# Patient Record
Sex: Female | Born: 1985 | Race: Black or African American | Hispanic: No | Marital: Single | Smoking: Current every day smoker
Health system: Southern US, Community
[De-identification: ages and names within clinical notes are randomized; demographics above are authoritative.]

## PROBLEM LIST (undated history)

## (undated) DIAGNOSIS — A64 Unspecified sexually transmitted disease: Secondary | ICD-10-CM

## (undated) DIAGNOSIS — IMO0002 Reserved for concepts with insufficient information to code with codable children: Secondary | ICD-10-CM

## (undated) HISTORY — DX: Reserved for concepts with insufficient information to code with codable children: IMO0002

## (undated) HISTORY — DX: Unspecified sexually transmitted disease: A64

---
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
## 2007-07-04 ENCOUNTER — Other Ambulatory Visit: Admission: RE | Admit: 2007-07-04 | Discharge: 2007-07-04 | Payer: Self-pay | Admitting: Obstetrics and Gynecology

## 2009-11-03 ENCOUNTER — Emergency Department (HOSPITAL_COMMUNITY): Admission: EM | Admit: 2009-11-03 | Discharge: 2009-11-03 | Payer: Self-pay | Admitting: Emergency Medicine

## 2010-01-15 ENCOUNTER — Emergency Department (HOSPITAL_COMMUNITY): Admission: EM | Admit: 2010-01-15 | Discharge: 2010-01-15 | Payer: Self-pay | Admitting: Family Medicine

## 2010-01-21 ENCOUNTER — Ambulatory Visit: Payer: Self-pay | Admitting: Unknown Physician Specialty

## 2010-07-13 DIAGNOSIS — A64 Unspecified sexually transmitted disease: Secondary | ICD-10-CM

## 2010-07-13 HISTORY — DX: Unspecified sexually transmitted disease: A64

## 2010-09-28 LAB — POCT URINALYSIS DIP (DEVICE)
Glucose, UA: NEGATIVE mg/dL
Protein, ur: 100 mg/dL — AB
Specific Gravity, Urine: >=1.03 (ref 1.005–1.030)
Urobilinogen, UA: 4 mg/dL — ABNORMAL HIGH (ref 0.0–1.0)

## 2012-12-21 DIAGNOSIS — N920 Excessive and frequent menstruation with regular cycle: Secondary | ICD-10-CM | POA: Insufficient documentation

## 2013-05-05 ENCOUNTER — Other Ambulatory Visit: Payer: Self-pay | Admitting: Family Medicine

## 2013-05-05 ENCOUNTER — Ambulatory Visit
Admission: RE | Admit: 2013-05-05 | Discharge: 2013-05-05 | Disposition: A | Discharge status: Home / Self Care | Payer: PRIVATE HEALTH INSURANCE | Source: Ambulatory Visit | Attending: Family Medicine | Admitting: Family Medicine

## 2013-05-05 DIAGNOSIS — M25562 Pain in left knee: Secondary | ICD-10-CM

## 2013-05-05 DIAGNOSIS — M25552 Pain in left hip: Secondary | ICD-10-CM

## 2013-05-08 ENCOUNTER — Encounter: Payer: Self-pay | Admitting: Obstetrics and Gynecology

## 2013-05-08 ENCOUNTER — Ambulatory Visit (INDEPENDENT_AMBULATORY_CARE_PROVIDER_SITE_OTHER): Payer: 59 | Admitting: Obstetrics and Gynecology

## 2013-05-08 VITALS — BP 118/80 | HR 88 | Ht 63.0 in | Wt 205.5 lb

## 2013-05-08 DIAGNOSIS — Z01419 Encounter for gynecological examination (general) (routine) without abnormal findings: Secondary | ICD-10-CM

## 2013-05-08 DIAGNOSIS — Z113 Encounter for screening for infections with a predominantly sexual mode of transmission: Secondary | ICD-10-CM

## 2013-05-08 DIAGNOSIS — Z0000 Encounter for general adult medical examination without abnormal findings: Secondary | ICD-10-CM

## 2013-05-08 DIAGNOSIS — N926 Irregular menstruation, unspecified: Secondary | ICD-10-CM

## 2013-05-08 DIAGNOSIS — Z23 Encounter for immunization: Secondary | ICD-10-CM

## 2013-05-08 DIAGNOSIS — N390 Urinary tract infection, site not specified: Secondary | ICD-10-CM

## 2013-05-08 LAB — POCT URINALYSIS DIPSTICK
Bilirubin, UA: NEGATIVE
Glucose, UA: NEGATIVE

## 2013-05-08 LAB — HEMOGLOBIN, FINGERSTICK: Hemoglobin, fingerstick: 13.8 g/dL (ref 12.0–16.0)

## 2013-05-08 MED ORDER — NITROFURANTOIN MONOHYD MACRO 100 MG PO CAPS: 100.0000 mg | ORAL_CAPSULE | Freq: Two times a day (BID) | ORAL | Status: DC

## 2013-05-08 NOTE — Patient Instructions (Signed)
EXERCISE AND DIET:  We recommended that you start or continue a regular exercise program for good health. Regular exercise means any activity that makes your heart beat faster and makes you sweat.  We recommend exercising at least 30 minutes per day at least 3 days a week, preferably 4 or 5.  We also recommend a diet low in fat and sugar.  Inactivity, poor dietary choices and obesity can cause diabetes, heart attack, stroke, and kidney damage, among others.    ALCOHOL AND SMOKING:  Women should limit their alcohol intake to no more than 7 drinks/beers/glasses of wine (combined, not each!) per week. Moderation of alcohol intake to this level decreases your risk of breast cancer and liver damage. And of course, no recreational drugs are part of a healthy lifestyle.  And absolutely no smoking or even second hand smoke. Most people know smoking can cause heart and lung diseases, but did you know it also contributes to weakening of your bones? Aging of your skin?  Yellowing of your teeth and nails?  CALCIUM AND VITAMIN D:  Adequate intake of calcium and Vitamin D are recommended.  The recommendations for exact amounts of these supplements seem to change often, but generally speaking 600 mg of calcium (either carbonate or citrate) and 800 units of Vitamin D per day seems prudent. Certain women may benefit from higher intake of Vitamin D.  If you are among these women, your doctor will have told you during your visit.    PAP SMEARS:  Pap smears, to check for cervical cancer or precancers,  have traditionally been done yearly, although recent scientific advances have shown that most women can have pap smears less often.  However, every woman still should have a physical exam from her gynecologist every year. It will include a breast check, inspection of the vulva and vagina to check for abnormal growths or skin changes, a visual exam of the cervix, and then an exam to evaluate the size and shape of the uterus and  ovaries.  And after 25 years of age, a rectal exam is indicated to check for rectal cancers. We will also provide age appropriate advice regarding health maintenance, like when you should have certain vaccines, screening for sexually transmitted diseases, bone density testing, colonoscopy, mammograms, etc.   MAMMOGRAMS:  All women over 40 years old should have a yearly mammogram. Many facilities now offer a "3D" mammogram, which may cost around $50 extra out of pocket. If possible,  we recommend you accept the option to have the 3D mammogram performed.  It both reduces the number of women who will be called back for extra views which then turn out to be normal, and it is better than the routine mammogram at detecting truly abnormal areas.    COLONOSCOPY:  Colonoscopy to screen for colon cancer is recommended for all women at age 50.  We know, you hate the idea of the prep.  We agree, BUT, having colon cancer and not knowing it is worse!!  Colon cancer so often starts as a polyp that can be seen and removed at colonscopy, which can quite literally save your life!  And if your first colonoscopy is normal and you have no family history of colon cancer, most women don't have to have it again for 10 years.  Once every ten years, you can do something that may end up saving your life, right?  We will be happy to help you get it scheduled when you are ready.    Be sure to check your insurance coverage so you understand how much it will cost.  It may be covered as a preventative service at no cost, but you should check your particular policy.     Urinary Tract Infection Urinary tract infections (UTIs) can develop anywhere along your urinary tract. Your urinary tract is your body's drainage system for removing wastes and extra water. Your urinary tract includes two kidneys, two ureters, a bladder, and a urethra. Your kidneys are a pair of bean-shaped organs. Each kidney is about the size of your fist. They are located  below your ribs, one on each side of your spine. CAUSES Infections are caused by microbes, which are microscopic organisms, including fungi, viruses, and bacteria. These organisms are so small that they can only be seen through a microscope. Bacteria are the microbes that most commonly cause UTIs. SYMPTOMS  Symptoms of UTIs may vary by age and gender of the patient and by the location of the infection. Symptoms in young women typically include a frequent and intense urge to urinate and a painful, burning feeling in the bladder or urethra during urination. Older women and men are more likely to be tired, shaky, and weak and have muscle aches and abdominal pain. A fever may mean the infection is in your kidneys. Other symptoms of a kidney infection include pain in your back or sides below the ribs, nausea, and vomiting. DIAGNOSIS To diagnose a UTI, your caregiver will ask you about your symptoms. Your caregiver also will ask to provide a urine sample. The urine sample will be tested for bacteria and white blood cells. White blood cells are made by your body to help fight infection. TREATMENT  Typically, UTIs can be treated with medication. Because most UTIs are caused by a bacterial infection, they usually can be treated with the use of antibiotics. The choice of antibiotic and length of treatment depend on your symptoms and the type of bacteria causing your infection. HOME CARE INSTRUCTIONS  If you were prescribed antibiotics, take them exactly as your caregiver instructs you. Finish the medication even if you feel better after you have only taken some of the medication.  Drink enough water and fluids to keep your urine clear or pale yellow.  Avoid caffeine, tea, and carbonated beverages. They tend to irritate your bladder.  Empty your bladder often. Avoid holding urine for long periods of time.  Empty your bladder before and after sexual intercourse.  After a bowel movement, women should cleanse  from front to back. Use each tissue only once. SEEK MEDICAL CARE IF:   You have back pain.  You develop a fever.  Your symptoms do not begin to resolve within 3 days. SEEK IMMEDIATE MEDICAL CARE IF:   You have severe back pain or lower abdominal pain.  You develop chills.  You have nausea or vomiting.  You have continued burning or discomfort with urination. MAKE SURE YOU:   Understand these instructions.  Will watch your condition.  Will get help right away if you are not doing well or get worse. Document Released: 04/08/2005 Document Revised: 12/29/2011 Document Reviewed: 08/07/2011 [facility] Patient Information 2014 [place], [place].  Nitrofurantoin tablets or capsules What is this medicine? NITROFURANTOIN (nye troe fyoor AN toyn) is an antibiotic. It is used to treat urinary tract infections. This medicine may be used for other purposes; ask your health care provider or pharmacist if you have questions. What should I tell my health care provider before I take this  medicine? They need to know if you have any of these conditions: -anemia -diabetes -glucose-6-phosphate dehydrogenase deficiency -kidney disease -liver disease -lung disease -other chronic illness -an unusual or allergic reaction to nitrofurantoin, other antibiotics, other medicines, foods, dyes or preservatives -pregnant or trying to get pregnant -breast-feeding How should I use this medicine? Take this medicine by mouth with a glass of water. Follow the directions on the prescription label. Take this medicine with food or milk. Take your doses at regular intervals. Do not take your medicine more often than directed. Do not stop taking except on your doctor's advice. Talk to your pediatrician regarding the use of this medicine in children. While this drug may be prescribed for selected conditions, precautions do apply. Overdosage: If you think you have taken too much of this medicine contact a poison control  center or emergency room at once. NOTE: This medicine is only for you. Do not share this medicine with others. What if I miss a dose? If you miss a dose, take it as soon as you can. If it is almost time for your next dose, take only that dose. Do not take double or extra doses. What may interact with this medicine? -antacids containing magnesium trisilicate -probenecid -quinolone antibiotics like ciprofloxacin, lomefloxacin, norfloxacin and ofloxacin -sulfinpyrazone This list may not describe all possible interactions. Give your health care provider a list of all the medicines, herbs, non-prescription drugs, or dietary supplements you use. Also tell them if you smoke, drink alcohol, or use illegal drugs. Some items may interact with your medicine. What should I watch for while using this medicine? Tell your doctor or health care professional if your symptoms do not improve or if you get new symptoms. Drink several glasses of water a day. If you are taking this medicine for a long time, visit your doctor for regular checks on your progress. If you are diabetic, you may get a false positive result for sugar in your urine with certain brands of urine tests. Check with your doctor. What side effects may I notice from receiving this medicine? Side effects that you should report to your doctor or health care professional as soon as possible: -allergic reactions like skin rash or hives, swelling of the face, lips, or tongue -chest pain -cough -difficulty breathing -dizziness, drowsiness -fever or infection -joint aches or pains -pale or blue-tinted skin -redness, blistering, peeling or loosening of the skin, including inside the mouth -tingling, burning, pain, or numbness in hands or feet -unusual bleeding or bruising -unusually weak or tired -yellowing of eyes or skin Side effects that usually do not require medical attention (report to your doctor or health care professional if they continue  or are bothersome): -dark urine -diarrhea -headache -loss of appetite -nausea or vomiting -temporary hair loss This list may not describe all possible side effects. Call your doctor for medical advice about side effects. You may report side effects to FDA at 1-800-FDA-1088. Where should I keep my medicine? Keep out of the reach of children. Store at room temperature between 15 and 30 degrees C (59 and 86 degrees F). Protect from light. Throw away any unused medicine after the expiration date. NOTE: This sheet is a summary. It may not cover all possible information. If you have questions about this medicine, talk to your doctor, pharmacist, or health care provider.  2013, [employer]. (01/18/2008 3:56:47 PM)

## 2013-05-08 NOTE — Progress Notes (Signed)
Patient ID: Kristine [name], female   DOB: [birth year], 27 y.o.   MRN: [id number] GYNECOLOGY VISIT  PCP:  None  Referring provider:   HPI: 27 y.o.   Single  African American  female   G0P0 with Patient's last menstrual period was 04/07/2013.   here for   AEX. Trying for pregnancy and three years.  No contraception for 1 1/2 years.  Never been pregnant before and partner has not fathered any children. Menses irregular.  Has  3 menses per year. Has times when she bleeds for months at at time.  No cramping with menses.   No prior evaluation.  Used condoms for contraception in past.  No hormonal contraception.  Has dyspareunia and dysuria with voiding after intercourse, onset the past year.  Has a history of UTI 2 years ago.    Treated for PID in 2012.  Diagnosed with chlamydia.    Hgb:    13.8 Urine:  Pos. Nitrites, 1+WBC's, Trace RBC's  GYNECOLOGIC HISTORY: Patient's last menstrual period was 04/07/2013. Sexually active:  yes Partner preference: female Contraception:  Trying for pregnancy  Menopausal hormone therapy: n/a DES exposure:   n/a Blood transfusions:  no  Sexually transmitted diseases:   Treated for Chlamydia in 2012 GYN Procedures:  no Mammogram:   n/a              Pap:  2009 wnl  History of abnormal pap smear:  ?2007 had abnormal pap and possibly colposcopy but no treatment of cervix.  Paps reverted to normal.   OB History   Grav Para Term Preterm Abortions TAB SAB Ect Mult Living   0                LIFESTYLE: Exercise:       Sit ups and leg crunches        Tobacco:       1/2 pack per day Alcohol:          1 glass of wine per week Drug use:       no  OTHER HEALTH MAINTENANCE: Tetanus/TDap:    Unsure. Gardisil:    no Influenza:   never Zostavax:   n/a  Bone density:  n/a Colonoscopy:  n/a  Cholesterol check:  unsure  History reviewed. No pertinent family history.  There are no active problems to display for this patient.  Past Medical  History  Diagnosis Date  . Dyspareunia   . STD (sexually transmitted disease) 2012    Tx' d for Chlamydia    Past Surgical History  Procedure Laterality Date  . Breast surgery  2000    benign biopsy    ALLERGIES: Review of patient's allergies indicates no known allergies.  No current outpatient prescriptions on file.   No current facility-administered medications for this visit.     ROS:  Pertinent items are noted in HPI.  SOCIAL HISTORY:    PHYSICAL EXAMINATION:    BP 118/80  Pulse 88  Ht 5\' 3"  (1.6 m)  Wt 205 lb 8 oz (93.214 kg)  BMI 36.41 kg/m2  LMP 04/07/2013   Wt Readings from Last 3 Encounters:  05/08/13 205 lb 8 oz (93.214 kg)     Ht Readings from Last 3 Encounters:  05/08/13 5\' 3"  (1.6 m)    General appearance: alert, cooperative and appears stated age Head: Normocephalic, without obvious abnormality, atraumatic Neck: no adenopathy, supple, symmetrical, trachea midline and thyroid not enlarged, symmetric, no tenderness/mass/nodules Lungs: clear to auscultation bilaterally Breasts: Inspection  negative, No nipple retraction or dimpling, No nipple discharge or bleeding, No axillary or supraclavicular adenopathy, Normal to palpation without dominant masses Heart: regular rate and rhythm Abdomen: soft, non-tender; no masses,  no organomegaly Extremities: extremities normal, atraumatic, no cyanosis or edema Skin: Skin color, texture, turgor normal. No rashes or lesions Lymph nodes: Cervical, supraclavicular, and axillary nodes normal. No abnormal inguinal nodes palpated Neurologic: Grossly normal  Pelvic: External genitalia:  no lesions              Urethra:  normal appearing urethra with no masses, tenderness or lesions              Bartholins and Skenes: normal                 Vagina: normal appearing vagina with normal color and discharge, no lesions              Cervix: normal appearance.  Some small amount of menstrual flow from os.               Pap  and high risk HPV testing done: yes, reflex HPV testing..            Bimanual Exam:  Uterus:  uterus is normal size, shape, consistency and nontender                                      Adnexa: normal adnexa in size, nontender and no masses                                      Rectovaginal: Confirms                                      Anus:  normal sphincter tone, no lesions  ASSESSMENT  Normal gynecologic exam. Desire for pregnancy. Irregular menses.  History of PID. Need for Tdap. UTI suspected. Will treat as patient is symptomatic.  Smoker.  PLAN  Mammogram Pap smear and reflex  high risk HPV testing Labs:  GC/CT, HIV, RPR, Hep C, and Hep B.             FSH, LH, prolactin, TSH, estradiol. Macrobid 100 mg po bid for 7 days.   UC sent.  TDap. Discussed smoking cessation. Start PNV.  Patient will return for discussion of labs and planning for future fertility - HSG, semen analysis, and planning for ovulation evaluation and monitoring.  Return annually.   An After Visit Summary was printed and given to the patient.

## 2013-05-09 LAB — STD PANEL: Hepatitis B Surface Ag: NEGATIVE

## 2013-05-09 LAB — GC/CHLAMYDIA PROBE AMP, URINE
Chlamydia, Swab/Urine, PCR: POSITIVE — AB
GC Probe Amp, Urine: NEGATIVE

## 2013-05-09 LAB — PROLACTIN: Prolactin: 9.5 ng/mL

## 2013-05-09 LAB — IPS PAP TEST WITH REFLEX TO HPV

## 2013-05-09 LAB — ESTRADIOL: Estradiol: 48.1 pg/mL

## 2013-05-09 LAB — HEPATITIS C ANTIBODY: HCV Ab: NEGATIVE

## 2013-05-09 LAB — FSH/LH: FSH: 5.2 m[IU]/mL

## 2013-05-10 ENCOUNTER — Other Ambulatory Visit: Payer: Self-pay | Admitting: Obstetrics and Gynecology

## 2013-05-10 LAB — URINE CULTURE: Colony Count: >=100000

## 2013-05-10 MED ORDER — AZITHROMYCIN 1 G PO PACK: 1.0000 | PACK | Freq: Once | ORAL | Status: DC

## 2013-05-11 ENCOUNTER — Other Ambulatory Visit: Payer: Self-pay | Admitting: Obstetrics and Gynecology

## 2013-05-11 MED ORDER — CIPROFLOXACIN HCL 500 MG PO TABS: 500.0000 mg | ORAL_TABLET | Freq: Two times a day (BID) | ORAL | Status: DC

## 2013-05-12 ENCOUNTER — Telehealth: Payer: Self-pay | Admitting: Obstetrics and Gynecology

## 2013-05-12 NOTE — Telephone Encounter (Signed)
Dr. Edward Jolly, since the zithromax is only one dose is it okay to take both the Cipro and Zithromax?

## 2013-05-12 NOTE — Telephone Encounter (Signed)
Patient calling to make sure it is ok to take medications together cipro and and zithromax. She didn't know if she could take them together or if she needed to wait until she finished with the first medication. ( NO Chart)

## 2013-05-12 NOTE — Telephone Encounter (Signed)
Take the Zithromax separately.  This is just a one time dose of this.  Take the Cipro two hours later.  I think that both together will cause GI upset.

## 2013-05-12 NOTE — Telephone Encounter (Signed)
Spoke with patient and message from Dr. Edward Jolly given.  Verbalized understanding and will f/u prn.

## 2013-05-17 ENCOUNTER — Encounter: Payer: Self-pay | Admitting: Obstetrics and Gynecology

## 2013-05-17 ENCOUNTER — Ambulatory Visit (INDEPENDENT_AMBULATORY_CARE_PROVIDER_SITE_OTHER): Payer: 59 | Admitting: Obstetrics and Gynecology

## 2013-05-17 ENCOUNTER — Telehealth: Payer: Self-pay | Admitting: Gynecology

## 2013-05-17 VITALS — BP 118/64 | HR 76 | Wt 205.0 lb

## 2013-05-17 DIAGNOSIS — A568 Sexually transmitted chlamydial infection of other sites: Secondary | ICD-10-CM

## 2013-05-17 DIAGNOSIS — N390 Urinary tract infection, site not specified: Secondary | ICD-10-CM

## 2013-05-17 DIAGNOSIS — N979 Female infertility, unspecified: Secondary | ICD-10-CM

## 2013-05-17 NOTE — Progress Notes (Signed)
Patient ID: Kristine [name], female   DOB: 1986-03-13, 27 y.o.   MRN: [id number]  Subjective  Here for a discussion of lab results.  LMP 05/08/13.  Took Azithromycin and vomited 4 - 5 hours later.    Started the Macrobid but had abdominal cramping and so was switched to Ciprofloxacin, which she did not take yet. Has this Rx at home.    Came with questions today about what to take and for what reason.    Pain in lungs today, sharp. No nausea or vomiting today.  No fevers and chills.  Eating OK.   Partner has an appointment to have evaluation and treatment for chlamydia and STDs.    Patient had chlamydia treatment and a negative test of cure in 2012.   Patient also would like to proceed with evaluation for fertility.  Unprotected intercourse for over one year.   Objective  Gen - No acute distress. Lungs - CTA bilaterally. Cor - S1S2 RRR. Abdomen - positive bowel sounds, soft, nontender, nondistended.  No hepatosplenomegaly or organomegaly.   FSH 52. LH 13.2 Estradiol 48.1 TSH and Prolactin - normal.  Assessment  Chlamydia Trachomatis. Urinary tract infection, untreated. History of prior PID. Primary infertility.  Ovulatory component.    Plan  Start Ciprofloxacin. Follow up in 9 days for chlamydia test of cure and repeat urine culture. I discussed the work up and evaluation for infertility along with treatment with Clomid. After infection is treated, will proceed with semen analysis and  Hysterosalpingogram. Patient understands that she and partner both need to be treated for chlamydia.   25 minutes face to face time of which over 505 was spent in counseling the patient.  After visit summary to the patient.

## 2013-05-17 NOTE — Patient Instructions (Signed)
Chlamydia, Female Chlamydia is an infection caused by bacteria. It is spread through sexual contact. Chlamydia can be in different areas of the body. These areas include the cervix, urethra, throat, or rectum. If you are infected, you must finish all treatments and follow up with a caregiver.  CAUSES  Chlamydia is a sexually transmitted disease. It is passed from an infected partner during intimate contact. This contact could be with the genitals, mouth, or rectal area. Infections can also be passed from mothers to babies during birth. SYMPTOMS  There may not be any symptoms. This is often the case early in the infection. Symptoms you may notice include:  Mild pain and discomfort when urinating.  Inflammation of the rectum.  Vaginal discharge.  Painful intercourse.  Abdominal pain.  Bleeding between menstrual periods. DIAGNOSIS  To diagnose this infection, your caregiver will do a pelvic exam. Cultures will be taken of the vagina, cervix, urine, and possibly the rectum to see if the infection is chlamydia. TREATMENT You will be given antibiotic medicines. Any sexual partners should also be treated, even if they do not show symptoms. Take the medicine for the prescribed length of time. If you are pregnant, do not take tetracycline-type antibiotics. HOME CARE INSTRUCTIONS   Take your antibiotics as directed. Finish them even if you start to feel better.  Only take over-the-counter or prescription medicines for pain, discomfort, or fever as directed by your caregiver.  Inform any sexual partners about the infection. They should be treated also.  Do not have sexual contact until your caregiver tells you it is okay.  Get plenty of rest.  Eat a well-balanced diet, and drink enough fluids to keep your urine clear or pale yellow.  Keep all follow-up appointments and tests. SEEK IMMEDIATE MEDICAL CARE IF:   Your symptoms return.  You have a fever. MAKE SURE YOU:   Understand these  instructions.  Will watch your condition.  Will get help right away if you are not doing well or get worse. Document Released: 04/08/2005 Document Revised: 09/21/2011 Document Reviewed: 02/15/2008 [facility] Patient Information 2014 [place], [place].  Urinary Tract Infection Urinary tract infections (UTIs) can develop anywhere along your urinary tract. Your urinary tract is your body's drainage system for removing wastes and extra water. Your urinary tract includes two kidneys, two ureters, a bladder, and a urethra. Your kidneys are a pair of bean-shaped organs. Each kidney is about the size of your fist. They are located below your ribs, one on each side of your spine. CAUSES Infections are caused by microbes, which are microscopic organisms, including fungi, viruses, and bacteria. These organisms are so small that they can only be seen through a microscope. Bacteria are the microbes that most commonly cause UTIs. SYMPTOMS  Symptoms of UTIs may vary by age and gender of the patient and by the location of the infection. Symptoms in young women typically include a frequent and intense urge to urinate and a painful, burning feeling in the bladder or urethra during urination. Older women and men are more likely to be tired, shaky, and weak and have muscle aches and abdominal pain. A fever may mean the infection is in your kidneys. Other symptoms of a kidney infection include pain in your back or sides below the ribs, nausea, and vomiting. DIAGNOSIS To diagnose a UTI, your caregiver will ask you about your symptoms. Your caregiver also will ask to provide a urine sample. The urine sample will be tested for bacteria and white blood cells.  White blood cells are made by your body to help fight infection. TREATMENT  Typically, UTIs can be treated with medication. Because most UTIs are caused by a bacterial infection, they usually can be treated with the use of antibiotics. The choice of antibiotic and length  of treatment depend on your symptoms and the type of bacteria causing your infection. HOME CARE INSTRUCTIONS  If you were prescribed antibiotics, take them exactly as your caregiver instructs you. Finish the medication even if you feel better after you have only taken some of the medication.  Drink enough water and fluids to keep your urine clear or pale yellow.  Avoid caffeine, tea, and carbonated beverages. They tend to irritate your bladder.  Empty your bladder often. Avoid holding urine for long periods of time.  Empty your bladder before and after sexual intercourse.  After a bowel movement, women should cleanse from front to back. Use each tissue only once. SEEK MEDICAL CARE IF:   You have back pain.  You develop a fever.  Your symptoms do not begin to resolve within 3 days. SEEK IMMEDIATE MEDICAL CARE IF:   You have severe back pain or lower abdominal pain.  You develop chills.  You have nausea or vomiting.  You have continued burning or discomfort with urination. MAKE SURE YOU:   Understand these instructions.  Will watch your condition.  Will get help right away if you are not doing well or get worse. Document Released: 04/08/2005 Document Revised: 12/29/2011 Document Reviewed: 08/07/2011 [facility] Patient Information 2014 [place], [place].  Infertility WHAT IS INFERTILITY?  Infertility is usually defined as not being able to get pregnant after trying for one year of regular sexual intercourse without the use of contraceptives. Or not being able to carry a pregnancy to term and have a baby. The infertility rate in the [place] is around 10%. Pregnancy is the result of a chain of events. A woman must release an egg from one of her ovaries (ovulation). The egg must be fertilized by the female sperm. Then it travels through a fallopian tube into the uterus (womb), where it attaches to the wall of the uterus and grows. A man must have enough sperm, and the sperm must  join with (fertilize) the egg along the way, at the proper time. The fertilized egg must then become attached to the inside of the uterus. While this may seem simple, many things can happen to prevent pregnancy from occurring.  WHOSE PROBLEM IS IT?  About 20% of infertility cases are due to problems with the man (female factors) and 65% are due to problems with the woman (female factors). Other cases are due to a combination of female and female factors or to unknown causes.  WHAT CAUSES INFERTILITY IN MEN?  Infertility in men is often caused by problems with making enough normal sperm or getting the sperm to reach the egg. Problems with sperm may exist from birth or develop later in life, due to illness or injury. Some men produce no sperm, or produce too few sperm (oligospermia). Other problems include:  Sexual dysfunction.  Hormonal or endocrine problems.  Age. Female fertility decreases with age, but not at as young an age as female fertility.  Infection.  Congenital problems. Birth defect, such as absence of the tubes that carry the sperm (vas deferens).  Genetic/chromosomal problems.  Antisperm antibody problems.  Retrograde ejaculation (sperm go into the bladder).  Varicoceles, spematoceles, or tumors of the testicles.  Lifestyle can influence the  number and quality of a man's sperm.  Alcohol and drugs can temporarily reduce sperm quality.  Environmental toxins, including pesticides and lead, may cause some cases of infertility in men. WHAT CAUSES INFERTILITY IN WOMEN?   Problems with ovulation account for most infertility in women. Without ovulation, eggs are not available to be fertilized.  Signs of problems with ovulation include irregular menstrual periods or no periods at all.  Simple lifestyle factors, including stress, diet, or athletic training, can affect a woman's hormonal balance.  Age. Fertility begins to decrease in women in the early 36s and is worse after age  53.  Much less often, a hormonal imbalance from a serious medical problem, such as a pituitary gland tumor, thyroid or other chronic medical disease, can cause ovulation problems.  Pelvic infections.  Polycystic ovary syndrome (increase in female hormones, unable to ovulate).  Alcohol or illegal drugs.  Environmental toxins, radiation, pesticides, and certain chemicals.  Aging is an important factor in female infertility.  The ability of a woman's ovaries to produce eggs declines with age, especially after age 58. About one third of couples where the woman is over 35 will have problems with fertility.  By the time she reaches menopause when her monthly periods stop for good, a woman can no longer produce eggs or become pregnant.  Other problems can also lead to infertility in women. If the fallopian tubes are blocked at one or both ends, the egg cannot travel through the tubes into the uterus. Scar tissue (adhesions) in the pelvis may cause blocked tubes. This may result from pelvic inflammatory disease, endometriosis, or surgery for an ectopic pregnancy (fertilized egg implanted outside the uterus) or any pelvic or abdominal surgery causing adhesions.  Fibroid tumors or polyps of the uterus.  Congenital (birth defect) abnormalities of the uterus.  Infection of the cervix (cervicitis).  Cervical stenosis (narrowing).  Abnormal cervical mucus.  Polycystic ovary syndrome.  Having sexual intercourse too often (every other day or 4 to 5 times a week).  Obesity.  Anorexia.  Poor nutrition.  Over exercising, with loss of body fat.  DES. Your mother received diethylstilbesterol hormone when pregnant with you. HOW IS INFERTILITY TESTED?  If you have been trying to have a baby without success, you may want to seek medical help. You should not wait for one year of trying before seeing a health care provider if:  You are over 35.  You have reason to believe that there may be a  fertility problem. A medical evaluation may determine the reasons for a couple's infertility. Usually this process begins with:  Physical exams.  Medical histories of both partners.  Sexual histories of both partners. If there is no obvious problem, like improperly timed intercourse or absence of ovulation, tests may be needed.   For a man, testing usually begins with tests of his semen to look at:  The number of sperm.  The shape of sperm.  Movement of his sperm.  Taking a complete medical and surgical history.  Physical examination.  Check for infection of the female reproductive organs. Sometimes hormone tests are done.   For a woman, the first step in testing is to find out if she is ovulating each month. There are several ways to do this. For example, she can keep track of changes in her morning body temperature and in the texture of her cervical mucus. Another tool is a home ovulation test kit, which can be bought at drug or grocery stores.  Checks of ovulation can also be done in the doctor's office, using blood tests for hormone levels or ultrasound tests of the ovaries. If the woman is ovulating, more tests will need to be done. Some common female tests include:  Hysterosalpingogram: An x-ray of the fallopian tubes and uterus after they are injected with dye. It shows if the tubes are open and shows the shape of the uterus.  Laparoscopy: An exam of the tubes and other female organs for disease. A lighted tube called a laparoscope is used to see inside the abdomen.  Endometrial biopsy: Sample of uterus tissue taken on the first day of the menstrual period, to see if the tissue indicates you are ovulating.  Transvaginal ultrasound: Examines the female organs.  Hysteroscopy: Uses a lighted tube to examine the cervix and inside the uterus, to see if there are any abnormalities inside the uterus. TREATMENT  Depending on the test results, different treatments can be suggested.  The type of treatment depends on the cause. 85 to 90% of infertility cases are treated with drugs or surgery.   Various fertility drugs may be used for women with ovulation problems. It is important to talk with your caregiver about the drug to be used. You should understand the drug's benefits and side effects. Depending on the type of fertility drug and the dosage of the drug used, multiple births (twins or multiples) can occur in some women.  If needed, surgery can be done to repair damage to a woman's ovaries, fallopian tubes, cervix, or uterus.  Surgery or medical treatment for endometriosis or polycystic ovary syndrome. Sometimes a man has an infertility problem that can be corrected with medicine or by surgery.  Intrauterine insemination (IUI) of sperm, timed with ovulation.  Change in lifestyle, if that is the cause (lose weight, increase exercise, and stop smoking, drinking excessively, or taking illegal drugs).  Other types of surgery:  Removing growths inside and on the uterus.  Removing scar tissue from inside of the uterus.  Fixing blocked tubes.  Removing scar tissue in the pelvis and around the female organs. WHAT IS ASSISTED REPRODUCTIVE TECHNOLOGY (ART)?  Assisted reproductive technology (ART) is another form of special methods used to help infertile couples. ART involves handling both the woman's eggs and the man's sperm. Success rates vary and depend on many factors. ART can be expensive and time-consuming. But ART has made it possible for many couples to have children that otherwise would not have been conceived. Some methods are listed below:  In vitro fertilization (IVF). IVF is often used when a woman's fallopian tubes are blocked or when a man has low sperm counts. A drug is used to stimulate the ovaries to produce multiple eggs. Once mature, the eggs are removed and placed in a culture dish with the man's sperm for fertilization. After about 40 hours, the eggs are  examined to see if they have become fertilized by the sperm and are dividing into cells. These fertilized eggs (embryos) are then placed in the woman's uterus. This bypasses the fallopian tubes.  Gamete intrafallopian transfer (GIFT) is similar to IVF, but used when the woman has at least one normal fallopian tube. Three to five eggs are placed in the fallopian tube, along with the man's sperm, for fertilization inside the woman's body.  Zygote intrafallopian transfer (ZIFT), also called tubal embryo transfer, combines IVF and GIFT. The eggs retrieved from the woman's ovaries are fertilized in the lab and placed in the fallopian tubes rather  than in the uterus.  ART procedures sometimes involve the use of donor eggs (eggs from another woman) or previously frozen embryos. Donor eggs may be used if a woman has impaired ovaries or has a genetic disease that could be passed on to her baby.  When performing ART, you are at higher risk for resulting in multiple pregnancies, twins, triplets or more.  Intracytoplasma sperm injection is a procedure that injects a single sperm into the egg to fertilize it.  Embryo transplant is a procedure that starts after growing an embryo in a special media (chemical solution) developed to keep the embryo alive for 2 to 5 days, and then transplanting it into the uterus. In cases where a cause cannot be found and pregnancy does not occur, adoption may be a consideration. Document Released: 07/02/2003 Document Revised: 09/21/2011 Document Reviewed: 05/28/2009 [facility] Patient Information 2014 [place], [place].

## 2013-05-17 NOTE — Telephone Encounter (Signed)
Patient is due for a 10 day recheck 05/26/13 but there are no appointment slots available. Please advise where we can schedule her and I will call the patient.

## 2013-05-17 NOTE — Telephone Encounter (Signed)
Hi Starla,  This is a quick appointment.  I will see her at 7:00 am on 05/26/13.  I know that there is also a new patient at this time, but she will not be ready for me to see at 7:00 am.  Thanks for checking with me!

## 2013-05-18 NOTE — Telephone Encounter (Signed)
Patient scheduled.

## 2013-05-26 ENCOUNTER — Encounter: Payer: Self-pay | Admitting: Obstetrics and Gynecology

## 2013-05-26 ENCOUNTER — Ambulatory Visit (INDEPENDENT_AMBULATORY_CARE_PROVIDER_SITE_OTHER): Payer: 59 | Admitting: Obstetrics and Gynecology

## 2013-05-26 VITALS — BP 120/80 | HR 80 | Temp 98.2°F | Ht 63.0 in | Wt 207.5 lb

## 2013-05-26 DIAGNOSIS — N926 Irregular menstruation, unspecified: Secondary | ICD-10-CM

## 2013-05-26 DIAGNOSIS — A749 Chlamydial infection, unspecified: Secondary | ICD-10-CM

## 2013-05-26 DIAGNOSIS — N390 Urinary tract infection, site not specified: Secondary | ICD-10-CM

## 2013-05-26 LAB — POCT URINALYSIS DIPSTICK
Nitrite, UA: NEGATIVE
Urobilinogen, UA: NEGATIVE

## 2013-05-26 MED ORDER — MEDROXYPROGESTERONE ACETATE 10 MG PO TABS: 10.0000 mg | ORAL_TABLET | Freq: Every day | ORAL | Status: DC

## 2013-05-26 NOTE — Progress Notes (Signed)
Patient ID: Kristine [name], female   DOB: 05/10/1986, 27 y.o.   MRN: [id number]  Subjective  Patient here for a recheck for UTI, chlamydia, and also irregular menses. Took Ciprofloxacin without problem.  No urgency to void. Some cramping when sits down and stands up.   No upper abdominal pain.  No fevers. No nausea or vomiting.   Took azithromycin and threw up 4 - 5 hours lager. Patient's partner has been treated for infection.   Menses irregular. LMP from 05/07/13 - 05/12/13. Restarted 05/14/13 - 05/21/13. Restarted 05/22/13 - 05/26/13. Has had both light spotting and heavy bleeding.  No pain or cramping.   Objective  BP 120/80   P 80 T 98.2  Urine dip - trace RBCs.  Assessment  Recent UTI.  Status post Ciprofloxacin. Recent chlamydia.  Status post azithromycin. Irregular menses.  Plan  UPT today. Urine culture. Urine GC/CT. Provera 10 mg po q day for 10 days.  Medication discussed with patient.  15 minutes face to face time with over 50% spent in counseling.   After visit summary to the patient.

## 2013-05-26 NOTE — Patient Instructions (Signed)
Please register with My Chart!  The Provera should help to regular your menstruation.   Medroxyprogesterone tablets What is this medicine? MEDROXYPROGESTERONE (me DROX ee proe JES te rone) is a hormone in a class called progestins. It is commonly used to prevent the uterine lining from overgrowth in women taking an estrogen after menopause. It is also used to treat irregular menstrual bleeding or a lack of menstrual bleeding in women. This medicine may be used for other purposes; ask your health care provider or pharmacist if you have questions. COMMON BRAND NAME(S): Amen, Provera What should I tell my health care provider before I take this medicine? They need to know if you have any of these conditions: -blood vessel disease or a history of a blood clot in the lungs or legs -breast, cervical or vaginal cancer -heart disease -kidney disease -liver disease -migraine -recent miscarriage or abortion -mental depression -migraine -seizures (convulsions) -stroke -vaginal bleeding that has not been evaluated -an unusual or allergic reaction to medroxyprogesterone, other medicines, foods, dyes, or preservatives -pregnant or trying to get pregnant -breast-feeding How should I use this medicine? Take this medicine by mouth with a glass of water. Follow the directions on the prescription label. Take your doses at regular intervals. Do not take your medicine more often than directed. Talk to your pediatrician regarding the use of this medicine in children. Special care may be needed. While this drug may be prescribed for children as young as 13 years for selected conditions, precautions do apply. Overdosage: If you think you have taken too much of this medicine contact a poison control center or emergency room at once. NOTE: This medicine is only for you. Do not share this medicine with others. What if I miss a dose? If you miss a dose, take it as soon as you can. If it is almost time for your  next dose, take only that dose. Do not take double or extra doses. What may interact with this medicine? -barbiturate medicines for inducing sleep or treating seizures (convulsions) -bosentan -carbamazepine -phenytoin -rifampin -St. John's Wort This list may not describe all possible interactions. Give your health care provider a list of all the medicines, herbs, non-prescription drugs, or dietary supplements you use. Also tell them if you smoke, drink alcohol, or use illegal drugs. Some items may interact with your medicine. What should I watch for while using this medicine? Visit your health care professional for regular checks on your progress. You will need a regular breast and pelvic exam. If you have any reason to think you are pregnant, stop taking this medicine at once and contact your doctor or health care professional. What side effects may I notice from receiving this medicine? Side effects that you should report to your doctor or health care professional as soon as possible: -breast tenderness or discharge -changes in mood or emotions, such as depression -changes in vision or speech -pain in the abdomen, chest, groin, or leg -severe headache -skin rash, itching, or hives -sudden shortness of breath -unusually weak or tired -yellowing of skin or eyes Side effects that usually do not require medical attention (report to your doctor or health care professional if they continue or are bothersome): -acne -change in menstrual bleeding pattern or flow -changes in sexual desire -facial hair growth -fluid retention and swelling -headache -upset stomach -weight gain or loss This list may not describe all possible side effects. Call your doctor for medical advice about side effects. You may report side effects to  FDA at 1-800-FDA-1088. Where should I keep my medicine? Keep out of the reach of children. Store at room temperature between 20 and 25 degrees C (68 and 77 degrees F).  Throw away any unused medicine after the expiration date. NOTE: This sheet is a summary. It may not cover all possible information. If you have questions about this medicine, talk to your doctor, pharmacist, or health care provider.  2014, [employer]. (2008-06-28 11:26:12)

## 2013-05-26 NOTE — Addendum Note (Signed)
Addended by: Alphonsa Overall on: 05/26/2013 09:39 AM   Modules accepted: Orders

## 2013-05-27 LAB — GC/CHLAMYDIA PROBE AMP, URINE: GC Probe Amp, Urine: NEGATIVE

## 2013-06-20 ENCOUNTER — Telehealth: Payer: Self-pay | Admitting: Obstetrics and Gynecology

## 2013-06-20 NOTE — Telephone Encounter (Signed)
I do recommend a talking visit in the office to facilitate the next steps in her fertility evaluation.   Thanks!

## 2013-06-20 NOTE — Telephone Encounter (Signed)
Patient returned call. States that provera worked and she did have a withdrawal bleed. Now she wanted to know what was the next step. She states that she was told you specifically wanted to look at her fallopian tubes. Do you need to see patient for an office visit as next step?

## 2013-06-20 NOTE — Telephone Encounter (Signed)
Patient says she wants to schedule a check up appt from the medication given to start cycle.

## 2013-06-21 NOTE — Telephone Encounter (Signed)
Message left to return call to Excell Neyland at [phone].    

## 2013-06-21 NOTE — Telephone Encounter (Signed)
Patient return call and OV scheduled.

## 2013-07-21 ENCOUNTER — Encounter: Payer: Self-pay | Admitting: Obstetrics and Gynecology

## 2013-07-21 ENCOUNTER — Ambulatory Visit (INDEPENDENT_AMBULATORY_CARE_PROVIDER_SITE_OTHER): Payer: 59 | Admitting: Obstetrics and Gynecology

## 2013-07-21 VITALS — BP 104/66 | HR 64 | Ht 63.0 in | Wt 208.0 lb

## 2013-07-21 DIAGNOSIS — A749 Chlamydial infection, unspecified: Secondary | ICD-10-CM

## 2013-07-21 DIAGNOSIS — N970 Female infertility associated with anovulation: Secondary | ICD-10-CM

## 2013-07-21 NOTE — Progress Notes (Signed)
Patient ID: Kristine [name], female   DOB: 1986-04-20, 28 y.o.   MRN: [id number]  Subjective  Patient is here for a recheck for positive chlamydia and for fertility evaluation. Tried for fertility for over one year.  Took Azithromycin for positive chlamydia test.  Has irregular menses.  Testing showed LH > FSH. Took course of Provera. Last day of Provera was 06/06/13, and menses started on 06/07/13 - 06/19/13.  Menses was heavy. 07/10/13 nausea and vomiting. LMP 07/12/13 was very light, lasting only one day.  Vomited also on January 3. No UPT at home.  No breast tenderness or urinary frequency.  Has been trying for pregnancy during this time.   No dysuria. Some vaginal discharge, little heavier and whitish.  Has not done semen analysis or hysterosalpingogram.  Objective  BP  104/66   P 64 Weight 208 Height 5'3"  No exam  Assessment  Positive chlamydia. History of prior chlamydia/?PID Irregular menses. Primary infertility.  Plan  GC/CT from urine. UPT now. SA kit to patient for her partner. Will precert HSG, but patient will need negative GC/CT prior to doing this.  I will pretreat with Doxycycline 100 mg po prior to procedure and then 100 mg 12 hours later. Patient will likely need to take Provera 5 mg po for 5 days to induce a cycle prior to doing the HSG. I discussed Clomid 50 mg risks and benefits. Benefits, side effects, and risks discussed including 8 - 10 % of twinning.   I anticipate this prescription after HSG and SA are done. I discussed risk of ectopic pregnancy. May need one additional follow up appointment after all testing is complete to determine final plan.  25 minutes face to face time of which over 50% was spent in counseling.   After visit summary to patient.

## 2013-07-21 NOTE — Patient Instructions (Signed)
We will contact you to schedule your hysterosalpingogram after the repeat chlamydia and gonorrhea test return.

## 2013-07-22 LAB — GC/CHLAMYDIA PROBE AMP, URINE
CHLAMYDIA, SWAB/URINE, PCR: NEGATIVE
GC Probe Amp, Urine: NEGATIVE

## 2013-07-23 ENCOUNTER — Other Ambulatory Visit: Payer: Self-pay | Admitting: Obstetrics and Gynecology

## 2013-07-23 MED ORDER — DOXYCYCLINE HYCLATE 100 MG PO CAPS: ORAL_CAPSULE | ORAL | Status: DC

## 2013-08-16 ENCOUNTER — Telehealth: Payer: Self-pay | Admitting: Emergency Medicine

## 2013-08-16 NOTE — Telephone Encounter (Signed)
I would suggest an appointment with me on 08/21/13 at 7:00 am.

## 2013-08-16 NOTE — Telephone Encounter (Signed)
Message copied by FASMarc Morgans, Montie Swiderski L on Wed Aug 16, 2013  8:21 AM ------      Message from: AMUNDSON DE Gwenevere GhaziARVALHO E SILVA, BROOK E      Created: Wed Aug 16, 2013  5:52 AM      Regarding: Please contact patient to schedule a brief talking visit       Hi French Anaracy,            Please contact my patient so she and I can have a discussion about Provera and Clomid use.            She is interested in fertility and I want to create a clear plan with her.            Thanks! ------

## 2013-08-16 NOTE — Telephone Encounter (Signed)
Thank you for setting up this appointment.

## 2013-08-16 NOTE — Telephone Encounter (Signed)
Dr. Edward JollySilva,  Tiffany just advised me that the day I scheduled this patient for is now not open due to surgery being added on. This patient needs an early or late appointment. Can her appointment wait until March when you have openings since we have to reschedule?

## 2013-08-16 NOTE — Telephone Encounter (Signed)
Spoke with patient and she is agreeable to appointment. Scheduled for 08/30/13 at 0800 with Dr. Edward JollySilva.  Routing to provider for final review. Patient agreeable to disposition. Will close encounter

## 2013-08-17 NOTE — Telephone Encounter (Signed)
Spoke with patient, 2/9 at 0700 will work for her.

## 2013-08-17 NOTE — Telephone Encounter (Signed)
Message left to return call to Irondaleracy at [phone].   Advised appointment cancelled and rescheduled for 08/21/13 at 0700 and to call back if this will work with her.

## 2013-08-21 ENCOUNTER — Ambulatory Visit: Payer: 59 | Admitting: Obstetrics and Gynecology

## 2013-08-30 ENCOUNTER — Ambulatory Visit: Payer: 59 | Admitting: Obstetrics and Gynecology

## 2013-09-06 ENCOUNTER — Encounter (HOSPITAL_COMMUNITY): Payer: Self-pay | Admitting: Emergency Medicine

## 2013-09-06 ENCOUNTER — Emergency Department (INDEPENDENT_AMBULATORY_CARE_PROVIDER_SITE_OTHER)
Admission: EM | Admit: 2013-09-06 | Discharge: 2013-09-06 | Disposition: A | Discharge status: Home / Self Care | Payer: Self-pay | Source: Home / Self Care | Attending: Family Medicine | Admitting: Family Medicine

## 2013-09-06 DIAGNOSIS — R1110 Vomiting, unspecified: Secondary | ICD-10-CM

## 2013-09-06 DIAGNOSIS — R197 Diarrhea, unspecified: Secondary | ICD-10-CM

## 2013-09-06 LAB — POCT URINALYSIS DIP (DEVICE)
BILIRUBIN URINE: NEGATIVE
Glucose, UA: NEGATIVE mg/dL
Hgb urine dipstick: NEGATIVE
KETONES UR: NEGATIVE mg/dL
Leukocytes, UA: NEGATIVE
Nitrite: NEGATIVE
PH: 6 (ref 5.0–8.0)
PROTEIN: NEGATIVE mg/dL
UROBILINOGEN UA: 0.2 mg/dL (ref 0.0–1.0)

## 2013-09-06 LAB — POCT PREGNANCY, URINE: PREG TEST UR: NEGATIVE

## 2013-09-06 MED ORDER — ONDANSETRON 4 MG PO TBDP
ORAL_TABLET | ORAL | Status: AC
  Filled 2013-09-06: qty 1

## 2013-09-06 MED ORDER — ONDANSETRON 4 MG PO TBDP: 4.0000 mg | ORAL_TABLET | Freq: Three times a day (TID) | ORAL | Status: DC | PRN

## 2013-09-06 MED ORDER — ONDANSETRON 4 MG PO TBDP
4.0000 mg | ORAL_TABLET | Freq: Once | ORAL | Status: AC
  Administered 2013-09-06: 4 mg via ORAL

## 2013-09-06 NOTE — Discharge Instructions (Signed)
Diarrhea °Diarrhea is frequent loose and watery bowel movements. It can cause you to feel weak and dehydrated. Dehydration can cause you to become tired and thirsty, have a dry mouth, and have decreased urination that often is dark yellow. Diarrhea is a sign of another problem, most often an infection that will not last long. In most cases, diarrhea typically lasts 2 3 days. However, it can last longer if it is a sign of something more serious. It is important to treat your diarrhea as directed by your caregive to lessen or prevent future episodes of diarrhea. °CAUSES  °Some common causes include: °· Gastrointestinal infections caused by viruses, bacteria, or parasites. °· Food poisoning or food allergies. °· Certain medicines, such as antibiotics, chemotherapy, and laxatives. °· Artificial sweeteners and fructose. °· Digestive disorders. °HOME CARE INSTRUCTIONS °· Ensure adequate fluid intake (hydration): have 1 cup (8 oz) of fluid for each diarrhea episode. Avoid fluids that contain simple sugars or sports drinks, fruit juices, whole milk products, and sodas. Your urine should be clear or pale yellow if you are drinking enough fluids. Hydrate with an oral rehydration solution that you can purchase at pharmacies, retail stores, and online. You can prepare an oral rehydration solution at home by mixing the following ingredients together: °·   tsp table salt. °· ¾ tsp baking soda. °·  tsp salt substitute containing potassium chloride. °· 1  tablespoons sugar. °· 1 L (34 oz) of water. °· Certain foods and beverages may increase the speed at which food moves through the gastrointestinal (GI) tract. These foods and beverages should be avoided and include: °· Caffeinated and alcoholic beverages. °· High-fiber foods, such as raw fruits and vegetables, nuts, seeds, and whole grain breads and cereals. °· Foods and beverages sweetened with sugar alcohols, such as xylitol, sorbitol, and mannitol. °· Some foods may be well  tolerated and may help thicken stool including: °· Starchy foods, such as rice, toast, pasta, low-sugar cereal, oatmeal, grits, baked potatoes, crackers, and bagels. °· Bananas. °· Applesauce. °· Add probiotic-rich foods to help increase healthy bacteria in the GI tract, such as yogurt and fermented milk products. °· Wash your hands well after each diarrhea episode. °· Only take over-the-counter or prescription medicines as directed by your caregiver. °· Take a warm bath to relieve any burning or pain from frequent diarrhea episodes. °SEEK IMMEDIATE MEDICAL CARE IF:  °· You are unable to keep fluids down. °· You have persistent vomiting. °· You have blood in your stool, or your stools are black and tarry. °· You do not urinate in 6 8 hours, or there is only a small amount of very dark urine. °· You have abdominal pain that increases or localizes. °· You have weakness, dizziness, confusion, or lightheadedness. °· You have a severe headache. °· Your diarrhea gets worse or does not get better. °· You have a fever or persistent symptoms for more than 2 3 days. °· You have a fever and your symptoms suddenly get worse. °MAKE SURE YOU:  °· Understand these instructions. °· Will watch your condition. °· Will get help right away if you are not doing well or get worse. °Document Released: 06/19/2002 Document Revised: 06/15/2012 Document Reviewed: 03/06/2012 °ExitCare® Patient Information ©2014 [employer]. ° °Nausea and Vomiting °Nausea is a sick feeling that often comes before throwing up (vomiting). Vomiting is a reflex where stomach contents come out of your mouth. Vomiting can cause severe loss of body fluids (dehydration). Children and elderly adults can become   dehydrated quickly, especially if they also have diarrhea. Nausea and vomiting are symptoms of a condition or disease. It is important to find the cause of your symptoms. °CAUSES  °· Direct irritation of the stomach lining. This irritation can result from  increased acid production (gastroesophageal reflux disease), infection, food poisoning, taking certain medicines (such as nonsteroidal anti-inflammatory drugs), alcohol use, or tobacco use. °· Signals from the brain. These signals could be caused by a headache, heat exposure, an inner ear disturbance, increased pressure in the brain from injury, infection, a tumor, or a concussion, pain, emotional stimulus, or metabolic problems. °· An obstruction in the gastrointestinal tract (bowel obstruction). °· Illnesses such as diabetes, hepatitis, gallbladder problems, appendicitis, kidney problems, cancer, sepsis, atypical symptoms of a heart attack, or eating disorders. °· Medical treatments such as chemotherapy and radiation. °· Receiving medicine that makes you sleep (general anesthetic) during surgery. °DIAGNOSIS °Your caregiver may ask for tests to be done if the problems do not improve after a few days. Tests may also be done if symptoms are severe or if the reason for the nausea and vomiting is not clear. Tests may include: °· Urine tests. °· Blood tests. °· Stool tests. °· Cultures (to look for evidence of infection). °· X-rays or other imaging studies. °Test results can help your caregiver make decisions about treatment or the need for additional tests. °TREATMENT °You need to stay well hydrated. Drink frequently but in small amounts. You may wish to drink water, sports drinks, clear broth, or eat frozen ice pops or gelatin dessert to help stay hydrated. When you eat, eating slowly may help prevent nausea. There are also some antinausea medicines that may help prevent nausea. °HOME CARE INSTRUCTIONS  °· Take all medicine as directed by your caregiver. °· If you do not have an appetite, do not force yourself to eat. However, you must continue to drink fluids. °· If you have an appetite, eat a normal diet unless your caregiver tells you differently. °· Eat a variety of complex carbohydrates (rice, wheat, potatoes,  bread), lean meats, yogurt, fruits, and vegetables. °· Avoid high-fat foods because they are more difficult to digest. °· Drink enough water and fluids to keep your urine clear or pale yellow. °· If you are dehydrated, ask your caregiver for specific rehydration instructions. Signs of dehydration may include: °· Severe thirst. °· Dry lips and mouth. °· Dizziness. °· Dark urine. °· Decreasing urine frequency and amount. °· Confusion. °· Rapid breathing or pulse. °SEEK IMMEDIATE MEDICAL CARE IF:  °· You have blood or brown flecks (like coffee grounds) in your vomit. °· You have black or bloody stools. °· You have a severe headache or stiff neck. °· You are confused. °· You have severe abdominal pain. °· You have chest pain or trouble breathing. °· You do not urinate at least once every 8 hours. °· You develop cold or clammy skin. °· You continue to vomit for longer than 24 to 48 hours. °· You have a fever. °MAKE SURE YOU:  °· Understand these instructions. °· Will watch your condition. °· Will get help right away if you are not doing well or get worse. °Document Released: 06/29/2005 Document Revised: 09/21/2011 Document Reviewed: 11/26/2010 °ExitCare® Patient Information ©2014 [employer]. ° °

## 2013-09-06 NOTE — ED Notes (Signed)
C/o vomiting and diarrhea Tues.AM @ 0400.  D x 8-9 yesterday and today.  Vomited x 4-5 yesterday and 4 x today.  No abdominal pain.  Vomit was coming out of her nose and made her nose burn real bad yesterday AM.  She has been getting cold and then sweats but did not check her temp.

## 2013-09-06 NOTE — ED Provider Notes (Signed)
CSN: [id number]     Arrival date & time 09/06/13  1649 History   First MD Initiated Contact with Patient 09/06/13 1902     Chief Complaint  Patient presents with  . Diarrhea  . Emesis     (Consider location/radiation/quality/duration/timing/severity/associated sxs/prior Treatment) Patient is a 28 y.o. female presenting with diarrhea and vomiting. The history is provided by the patient. No language interpreter was used.  Diarrhea Quality:  Watery Severity:  Moderate Onset quality:  Gradual Number of episodes:  Multiple Duration:  1 day Timing:  Constant Progression:  Worsening Ineffective treatments:  None tried Associated symptoms: vomiting   Associated symptoms: no abdominal pain   Risk factors: no recent antibiotic use   Emesis Associated symptoms: diarrhea   Associated symptoms: no abdominal pain     Past Medical History  Diagnosis Date  . Dyspareunia   . STD (sexually transmitted disease) 2012    Tx' d for Chlamydia   Past Surgical History  Procedure Laterality Date  . Breast surgery  2000    benign biopsy   History reviewed. No pertinent family history. History  Substance Use Topics  . Smoking status: Current Every Day Smoker -- 0.50 packs/day for 3 years    Types: Cigarettes  . Smokeless tobacco: Not on file  . Alcohol Use: No   OB History   Grav Para Term Preterm Abortions TAB SAB Ect Mult Living   0              Review of Systems  Gastrointestinal: Positive for vomiting and diarrhea. Negative for abdominal pain.  All other systems reviewed and are negative.      Allergies  Macrobid  Home Medications   Current Outpatient Rx  Name  Route  Sig  Dispense  Refill  . doxycycline (VIBRAMYCIN) 100 MG capsule      Take one capsule 1 hour prior to procedure.  Take second capsule 12 hours later.   2 capsule   0    BP 125/90  Pulse 86  Temp(Src) 98.8 F (37.1 C) (Oral)  Resp 18  SpO2 98%  LMP 06/08/2013 Physical Exam  Nursing note and  vitals reviewed. Constitutional: She is oriented to person, place, and time. She appears well-developed and well-nourished.  HENT:  Head: Normocephalic.  Eyes: EOM are normal. Pupils are equal, round, and reactive to light.  Neck: Normal range of motion.  Cardiovascular: Normal rate.   Pulmonary/Chest: Effort normal.  Abdominal: She exhibits no distension. There is no tenderness.  Musculoskeletal: Normal range of motion.  Neurological: She is alert and oriented to person, place, and time.  Skin: Skin is warm.  Psychiatric: She has a normal mood and affect.    ED Course  Procedures (including critical care time) Labs Review Labs Reviewed  POCT URINALYSIS DIP (DEVICE)  POCT PREGNANCY, URINE   Imaging Review No results found.    MDM   Final diagnoses:  Vomiting  Diarrhea    Pt given zofran.  Pt tolerating fluids no vomitting.  Urine no dehydration  Pregnancy negative    Kristine [name], [place] 09/06/13 2042

## 2013-09-06 NOTE — ED Notes (Signed)
No further nausea.  Drank cup of water without vomiting.

## 2013-09-07 ENCOUNTER — Ambulatory Visit: Payer: Self-pay | Admitting: Obstetrics and Gynecology

## 2013-09-08 NOTE — ED Provider Notes (Signed)
Medical screening examination/treatment/procedure(s) were performed by a resident physician or non-physician practitioner and as the supervising physician I was immediately available for consultation/collaboration.  Kansas Spainhower, MD    Benn Tarver S Andi Layfield, MD 09/08/13 0739 

## 2013-09-18 ENCOUNTER — Ambulatory Visit: Payer: 59 | Admitting: Obstetrics and Gynecology

## 2013-09-25 ENCOUNTER — Ambulatory Visit: Payer: Self-pay | Admitting: Physician Assistant

## 2013-09-29 ENCOUNTER — Ambulatory Visit: Payer: Self-pay | Admitting: Obstetrics and Gynecology

## 2013-10-18 ENCOUNTER — Ambulatory Visit: Payer: Self-pay | Admitting: Obstetrics and Gynecology

## 2013-10-18 ENCOUNTER — Telehealth: Payer: Self-pay | Admitting: Obstetrics and Gynecology

## 2013-10-18 NOTE — Telephone Encounter (Signed)
No call necessary for rescheduling if the patient received our automated reminder call. Charge for no show.

## 2013-10-18 NOTE — Telephone Encounter (Signed)
I just checked and it doesn't look like she did get the call but she is the one who rescheduled this time so she new when the appt was it is her request. The call is just a courtesy. She rescheduled it 09/28/13.

## 2013-10-18 NOTE — Telephone Encounter (Signed)
Called patient and left a message for her to call back. Unable to leave any details on machine.

## 2013-10-18 NOTE — Telephone Encounter (Signed)
Please make an attempt to reach the patient to reschedule. Let her know that she was charged for a no show and that the office has a 24 hour cancellation policy for appointments.

## 2013-10-18 NOTE — Telephone Encounter (Signed)
Patient Kristine [name] appt today for an office visit to discuss fertility. Patient has had multiple cancellations for the same appt. Looks like 4 total and 2 of them were provider rescheduled. So two from here and now a no show. Do you want to go call [name] to reschedule?

## 2013-11-23 ENCOUNTER — Encounter: Payer: Self-pay | Admitting: Obstetrics and Gynecology

## 2014-05-15 ENCOUNTER — Emergency Department (HOSPITAL_COMMUNITY)
Admission: EM | Admit: 2014-05-15 | Discharge: 2014-05-15 | Disposition: A | Discharge status: Home / Self Care | Payer: Self-pay | Attending: Emergency Medicine | Admitting: Emergency Medicine

## 2014-05-15 ENCOUNTER — Emergency Department (HOSPITAL_COMMUNITY): Payer: Self-pay

## 2014-05-15 ENCOUNTER — Encounter (HOSPITAL_COMMUNITY): Payer: Self-pay | Admitting: Physical Medicine and Rehabilitation

## 2014-05-15 DIAGNOSIS — Z792 Long term (current) use of antibiotics: Secondary | ICD-10-CM | POA: Insufficient documentation

## 2014-05-15 DIAGNOSIS — R1084 Generalized abdominal pain: Secondary | ICD-10-CM | POA: Insufficient documentation

## 2014-05-15 DIAGNOSIS — Z8619 Personal history of other infectious and parasitic diseases: Secondary | ICD-10-CM | POA: Insufficient documentation

## 2014-05-15 DIAGNOSIS — K625 Hemorrhage of anus and rectum: Secondary | ICD-10-CM | POA: Insufficient documentation

## 2014-05-15 DIAGNOSIS — Z3202 Encounter for pregnancy test, result negative: Secondary | ICD-10-CM | POA: Insufficient documentation

## 2014-05-15 DIAGNOSIS — R590 Localized enlarged lymph nodes: Secondary | ICD-10-CM

## 2014-05-15 DIAGNOSIS — R109 Unspecified abdominal pain: Secondary | ICD-10-CM

## 2014-05-15 DIAGNOSIS — Z8742 Personal history of other diseases of the female genital tract: Secondary | ICD-10-CM | POA: Insufficient documentation

## 2014-05-15 DIAGNOSIS — R112 Nausea with vomiting, unspecified: Secondary | ICD-10-CM | POA: Insufficient documentation

## 2014-05-15 DIAGNOSIS — Z720 Tobacco use: Secondary | ICD-10-CM | POA: Insufficient documentation

## 2014-05-15 LAB — CBC WITH DIFFERENTIAL/PLATELET
Basophils Absolute: 0 10*3/uL (ref 0.0–0.1)
Basophils Relative: 0 % (ref 0–1)
EOS ABS: 0.3 10*3/uL (ref 0.0–0.7)
Eosinophils Relative: 2 % (ref 0–5)
HCT: 39.4 % (ref 36.0–46.0)
HEMOGLOBIN: 13.4 g/dL (ref 12.0–15.0)
LYMPHS ABS: 3.4 10*3/uL (ref 0.7–4.0)
LYMPHS PCT: 27 % (ref 12–46)
MCH: 29.6 pg (ref 26.0–34.0)
MCHC: 34 g/dL (ref 30.0–36.0)
MCV: 87.2 fL (ref 78.0–100.0)
MONOS PCT: 7 % (ref 3–12)
Monocytes Absolute: 0.9 10*3/uL (ref 0.1–1.0)
NEUTROS PCT: 64 % (ref 43–77)
Neutro Abs: 7.9 10*3/uL — ABNORMAL HIGH (ref 1.7–7.7)
Platelets: 308 10*3/uL (ref 150–400)
RBC: 4.52 MIL/uL (ref 3.87–5.11)
RDW: 12.8 % (ref 11.5–15.5)
WBC: 12.5 10*3/uL — AB (ref 4.0–10.5)

## 2014-05-15 LAB — COMPREHENSIVE METABOLIC PANEL
ALT: 44 U/L — ABNORMAL HIGH (ref 0–35)
ANION GAP: 14 (ref 5–15)
AST: 34 U/L (ref 0–37)
Albumin: 3.8 g/dL (ref 3.5–5.2)
Alkaline Phosphatase: 64 U/L (ref 39–117)
BILIRUBIN TOTAL: 0.2 mg/dL — AB (ref 0.3–1.2)
BUN: 8 mg/dL (ref 6–23)
CO2: 23 mEq/L (ref 19–32)
Calcium: 9.6 mg/dL (ref 8.4–10.5)
Chloride: 100 mEq/L (ref 96–112)
Creatinine, Ser: 0.67 mg/dL (ref 0.50–1.10)
GFR calc Af Amer: >90 mL/min (ref >90)
GLUCOSE: 112 mg/dL — AB (ref 70–99)
Potassium: 4.1 mEq/L (ref 3.7–5.3)
Sodium: 137 mEq/L (ref 137–147)
Total Protein: 8.1 g/dL (ref 6.0–8.3)

## 2014-05-15 LAB — URINALYSIS, ROUTINE W REFLEX MICROSCOPIC
BILIRUBIN URINE: NEGATIVE
GLUCOSE, UA: NEGATIVE mg/dL
KETONES UR: NEGATIVE mg/dL
Nitrite: NEGATIVE
PROTEIN: NEGATIVE mg/dL
Specific Gravity, Urine: 1.02 (ref 1.005–1.030)
UROBILINOGEN UA: 0.2 mg/dL (ref 0.0–1.0)
pH: 5.5 (ref 5.0–8.0)

## 2014-05-15 LAB — LIPASE, BLOOD: Lipase: 60 U/L — ABNORMAL HIGH (ref 11–59)

## 2014-05-15 LAB — POC OCCULT BLOOD, ED: Fecal Occult Bld: POSITIVE — AB

## 2014-05-15 LAB — URINE MICROSCOPIC-ADD ON

## 2014-05-15 LAB — POC URINE PREG, ED: PREG TEST UR: NEGATIVE

## 2014-05-15 MED ORDER — IOHEXOL 300 MG/ML  SOLN
100.0000 mL | Freq: Once | INTRAMUSCULAR | Status: AC | PRN
  Administered 2014-05-15: 100 mL via INTRAVENOUS

## 2014-05-15 MED ORDER — ONDANSETRON 4 MG PO TBDP: 4.0000 mg | ORAL_TABLET | Freq: Three times a day (TID) | ORAL | Status: DC | PRN

## 2014-05-15 MED ORDER — HYDROCODONE-ACETAMINOPHEN 5-325 MG PO TABS: ORAL_TABLET | ORAL | Status: DC

## 2014-05-15 MED ORDER — IOHEXOL 300 MG/ML  SOLN
25.0000 mL | INTRAMUSCULAR | Status: DC | PRN
  Administered 2014-05-15: 25 mL via ORAL
  Filled 2014-05-15: qty 30

## 2014-05-15 NOTE — ED Notes (Signed)
PA at the bedside.

## 2014-05-15 NOTE — ED Notes (Signed)
Patient returned from CT

## 2014-05-15 NOTE — Discharge Instructions (Signed)
Please read and follow all provided instructions.  Your diagnoses today include:  1. Rectal bleeding   2. Abdominal pain   3. Intra-abdominal lymphadenopathy     Tests performed today include:  Blood counts and electrolytes - slightly high white blood cell count  Blood tests to check liver and kidney function  Blood tests to check pancreas function  Urine test to look for infection and pregnancy (in women)  CT scan of your abdomen - shows swollen glands that are likely related to inflammation or infection, however as we discussed you need to have this rechecked to make sure the swollen glands go back to their usual size. If they did not, you may need evaluated for lymphoma.   Vital signs. See below for your results today.   Medications prescribed:   Vicodin (hydrocodone/acetaminophen) - narcotic pain medication  DO NOT drive or perform any activities that require you to be awake and alert because this medicine can make you drowsy. BE VERY CAREFUL not to take multiple medicines containing Tylenol (also called acetaminophen). Doing so can lead to an overdose which can damage your liver and cause liver failure and possibly death.   Zofran (ondansetron) - for nausea and vomiting  Take any prescribed medications only as directed.  Home care instructions:   Follow any educational materials contained in this packet.  Follow-up instructions: Please follow-up with the stomach doctor (gastroenterologist or GI) listed in 1 week for recheck.   Return instructions:  SEEK IMMEDIATE MEDICAL ATTENTION IF:  The pain does not go away or becomes severe   A temperature above 101F develops   Repeated vomiting occurs (multiple episodes)   The pain becomes localized to portions of the abdomen. The right side could possibly be appendicitis. In an adult, the left lower portion of the abdomen could be colitis or diverticulitis.   Blood is being passed in stools or vomit (bright red or black  tarry stools)   You develop chest pain, difficulty breathing, dizziness or fainting, or become confused, poorly responsive, or inconsolable (young children)  If you have any other emergent concerns regarding your health  Additional Information: Abdominal (belly) pain can be caused by many things. Your caregiver performed an examination and possibly ordered blood/urine tests and imaging (CT scan, x-rays, ultrasound). Many cases can be observed and treated at home after initial evaluation in the emergency department. Even though you are being discharged home, abdominal pain can be unpredictable. Therefore, you need a repeated exam if your pain does not resolve, returns, or worsens. Most patients with abdominal pain don't have to be admitted to the hospital or have surgery, but serious problems like appendicitis and gallbladder attacks can start out as nonspecific pain. Many abdominal conditions cannot be diagnosed in one visit, so follow-up evaluations are very important.  Your vital signs today were: BP 112/81 mmHg   Pulse 80   Temp(Src) 98.1 F (36.7 C) (Oral)   Resp 12   SpO2 100%   LMP 04/27/2014 If your blood pressure (bp) was elevated above 135/85 this visit, please have this repeated by your doctor within one month. --------------

## 2014-05-15 NOTE — ED Notes (Signed)
CT notified pt completed contrast.  

## 2014-05-15 NOTE — ED Provider Notes (Signed)
CSN: [id number]     Arrival date & time 05/15/14  1524 History   First MD Initiated Contact with Patient 05/15/14 1822     Chief Complaint  Patient presents with  . Rectal Bleeding     (Consider location/radiation/quality/duration/timing/severity/associated sxs/prior Treatment) HPI Comments: Patient presents with complaint of generalized abdominal pain, vomiting, and maroon stools for the past 2 days. No previous surgical history. Patient states that she was drinking beer 3 nights ago and vomited after drinking. After waking up the next morning she developed the abdominal pain. No fevers. She has had 2 episodes of vomiting total, last episode was lunchtime today. Stools are soft but not watery. She thinks her may have been some mucus in the stool. No urinary or vaginal symptoms. She does not have a diagnosis of Crohn's disease or ulcerative colitis. No treatments prior to arrival. No recent travel. No camping or drinking non-treated water. No recent antibiotic use.   Patient is a 28 y.o. female presenting with hematochezia. The history is provided by the patient.  Rectal Bleeding Associated symptoms: abdominal pain and vomiting   Associated symptoms: no fever     Past Medical History  Diagnosis Date  . Dyspareunia   . STD (sexually transmitted disease) 2012    Tx' d for Chlamydia   Past Surgical History  Procedure Laterality Date  . Breast surgery  2000    benign biopsy   No family history on file. History  Substance Use Topics  . Smoking status: Current Every Day Smoker -- 0.50 packs/day for 3 years    Types: Cigarettes  . Smokeless tobacco: Not on file  . Alcohol Use: No   OB History    Gravida Para Term Preterm AB TAB SAB Ectopic Multiple Living   0              Review of Systems  Constitutional: Negative for fever.  HENT: Negative for rhinorrhea and sore throat.   Eyes: Negative for redness.  Respiratory: Negative for cough.   Cardiovascular: Negative for chest  pain.  Gastrointestinal: Positive for nausea, vomiting, abdominal pain, blood in stool and hematochezia. Negative for diarrhea.  Genitourinary: Negative for dysuria.  Musculoskeletal: Negative for myalgias.  Skin: Negative for rash.  Neurological: Negative for headaches.      Allergies  Macrobid  Home Medications   Prior to Admission medications   Medication Sig Start Date End Date Taking? Authorizing Provider  doxycycline (VIBRAMYCIN) 100 MG capsule Take one capsule 1 hour prior to procedure.  Take second capsule 12 hours later. 07/23/13   Brook E Amundson de Gwenevere Ghaziarvalho E Silva, MD  ondansetron (ZOFRAN ODT) 4 MG disintegrating tablet Take 1 tablet (4 mg total) by mouth every 8 (eight) hours as needed for nausea or vomiting. 09/06/13   Elson AreasLeslie K Sofia, PA-C   BP 149/89 mmHg  Pulse 98  Temp(Src) 98.1 F (36.7 C) (Oral)  Resp 18  SpO2 99%   Physical Exam  Constitutional: She appears well-developed and well-nourished.  HENT:  Head: Normocephalic and atraumatic.  Eyes: Conjunctivae are normal. Right eye exhibits no discharge. Left eye exhibits no discharge.  Neck: Normal range of motion. Neck supple.  Cardiovascular: Normal rate, regular rhythm and normal heart sounds.   Pulmonary/Chest: Effort normal and breath sounds normal.  Abdominal: Soft. Bowel sounds are normal. She exhibits no distension. There is tenderness (mild, generalized). There is no rebound and no guarding.  Neurological: She is alert.  Skin: Skin is warm and dry.  Psychiatric:  She has a normal mood and affect.  Nursing note and vitals reviewed.   ED Course  Procedures (including critical care time) Labs Review Labs Reviewed  CBC WITH DIFFERENTIAL - Abnormal; Notable for the following:    WBC 12.5 (*)    Neutro Abs 7.9 (*)    All other components within normal limits  COMPREHENSIVE METABOLIC PANEL - Abnormal; Notable for the following:    Glucose, Bld 112 (*)    ALT 44 (*)    Total Bilirubin 0.2 (*)    All  other components within normal limits  LIPASE, BLOOD - Abnormal; Notable for the following:    Lipase 60 (*)    All other components within normal limits  URINALYSIS, ROUTINE W REFLEX MICROSCOPIC - Abnormal; Notable for the following:    APPearance HAZY (*)    Hgb urine dipstick TRACE (*)    Leukocytes, UA SMALL (*)    All other components within normal limits  URINE MICROSCOPIC-ADD ON - Abnormal; Notable for the following:    Squamous Epithelial / LPF FEW (*)    Bacteria, UA FEW (*)    All other components within normal limits  POC OCCULT BLOOD, ED - Abnormal; Notable for the following:    Fecal Occult Bld POSITIVE (*)    All other components within normal limits  POC URINE PREG, ED    Imaging Review Ct Abdomen Pelvis W Contrast  05/15/2014   CLINICAL DATA:  Bloody stools and diffuse abdominal pain. Nausea and vomiting for 2 days.  EXAM: CT ABDOMEN AND PELVIS WITH CONTRAST  TECHNIQUE: Multidetector CT imaging of the abdomen and pelvis was performed using the standard protocol following bolus administration of intravenous contrast.  CONTRAST:  100mL OMNIPAQUE IOHEXOL 300 MG/ML  SOLN  COMPARISON:  None.  FINDINGS: Visualization of the lower thorax demonstrates patchy lingular consolidation. No pleural effusion. Normal heart size.  Liver is normal in size and contour without focal hepatic lesion identified. Gallbladder is unremarkable. The spleen, pancreas and bilateral adrenal glands are unremarkable. Kidneys enhance symmetrically with contrast. No hydronephrosis.  Normal caliber abdominal aorta. Multiple enlarged retroperitoneal, porta hepatic and mesenteric lymph nodes are demonstrated. Reference right lower quadrant mesenteric lymph node measures 3.6 x 2.0 cm (image 58; series 5). Reference left para-aortic lymph node measures 1.6 x 0.9 cm (image 32; series 2). There is a 2.2 cm porta hepatic lymph node (image 27; series 2).  Urinary bladder is unremarkable. Uterus is unremarkable. Adnexal  structures are unremarkable.  No abnormal bowel wall thickening or evidence for bowel obstruction. The sigmoid colon is decompressed and poorly evaluated. The appendix is normal. No evidence for small bowel obstruction.  No aggressive or acute appearing osseous lesions.  IMPRESSION: Extensive retroperitoneal, porta hepatic and mesenteric lymphadenopathy. Causative etiology is nonspecific however neoplastic process such as lymphoma should be considered. Less likely considerations include an infectious or inflammatory process.  Patchy consolidation within the lingula. Primary consideration is atelectasis or focal infectious process. Consider short-term radiographic followup to ensure resolution.  These results were called by telephone at the time of interpretation on 05/15/2014 at 10:10 pm to Dr. Patria Maneampos, who verbally acknowledged these results.   Electronically Signed   By: Annia Beltrew  Davis M.D.   On: 05/15/2014 22:16     EKG Interpretation None       6:39 PM Patient seen and examined. Work-up initiated. Given abd pain, vomiting, blood in stool, and elevated WBC with elevated neutophils -- CT ordered to eval for colitis.  Vital signs reviewed and are as follows: BP 149/89 mmHg  Pulse 98  Temp(Src) 98.1 F (36.7 C) (Oral)  Resp 18  SpO2 99%  11:22 PM CT findings discussed with Dr. Patria Mane.   Pt and family at bedside informed. Informed that she has enlarged glands in her abdomen that will require recheck by GI/PCP to ensure return to normal. Informed that if they did not improve, she would need evaluated for lymphoma. Family and patient verbalized understanding and agreement with plan.  Will discharge to home with pain medication, nausea medication. Encouraged to rest and hydrate well for the next several days. Patient was provided with a work note.  If unable to follow-up with GI, patient encouraged to return to the emergency department for recheck in 2 weeks.  The patient was urged to return to the  Emergency Department immediately with worsening of current symptoms, worsening abdominal pain, persistent vomiting, larger amount of blood noted in stools, fever, or any other concerns. The patient verbalized understanding.     MDM   Final diagnoses:  Abdominal pain  Rectal bleeding  Intra-abdominal lymphadenopathy   Findings as above. Patient appears well, nontoxic. Vital signs are stable. She is not febrile. She is tolerating by mouth's well in ED. She will need GI follow-up to ensure resolution of lymphadenopathy which is likely reactive in nature, however lymphoma cannot be entirely ruled out until symptoms improve.appropriate return instructions discussed with patient at bedside.patient does not clinically have pneumonia.    Renne Crigler, PA-C 05/15/14 2326

## 2014-05-15 NOTE — ED Notes (Signed)
Pt presents to department for evaluation of rectal bleeding and abdominal pain. Onset Monday evening. States intermittent abdominal cramps. Denies urinary symptoms. Pt states several soft stools with blood today. Pt is alert and oriented x4.

## 2014-06-02 ENCOUNTER — Encounter: Payer: Self-pay | Admitting: Physician Assistant

## 2014-06-05 ENCOUNTER — Encounter: Payer: Self-pay | Admitting: Gastroenterology

## 2014-06-14 ENCOUNTER — Telehealth: Payer: Self-pay | Admitting: Obstetrics and Gynecology

## 2014-06-14 NOTE — Telephone Encounter (Signed)
Patient says she is retuning a call to [place] regarding her insurance.

## 2014-07-25 ENCOUNTER — Ambulatory Visit: Payer: Self-pay | Admitting: Gastroenterology

## 2014-08-01 ENCOUNTER — Encounter: Payer: Self-pay | Admitting: Obstetrics and Gynecology

## 2014-08-02 ENCOUNTER — Encounter: Payer: Self-pay | Admitting: Obstetrics & Gynecology

## 2014-08-02 ENCOUNTER — Telehealth: Payer: Self-pay

## 2014-08-02 ENCOUNTER — Ambulatory Visit (INDEPENDENT_AMBULATORY_CARE_PROVIDER_SITE_OTHER): Payer: PRIVATE HEALTH INSURANCE | Admitting: Obstetrics & Gynecology

## 2014-08-02 VITALS — BP 116/82 | HR 80 | Resp 16 | Wt 202.2 lb

## 2014-08-02 DIAGNOSIS — N921 Excessive and frequent menstruation with irregular cycle: Secondary | ICD-10-CM

## 2014-08-02 LAB — CBC WITH DIFFERENTIAL/PLATELET
BASOS PCT: 0 % (ref 0–1)
Basophils Absolute: 0 10*3/uL (ref 0.0–0.1)
EOS ABS: 0.3 10*3/uL (ref 0.0–0.7)
Eosinophils Relative: 3 % (ref 0–5)
HCT: 38.6 % (ref 36.0–46.0)
HEMOGLOBIN: 12.9 g/dL (ref 12.0–15.0)
LYMPHS PCT: 30 % (ref 12–46)
Lymphs Abs: 3.5 10*3/uL (ref 0.7–4.0)
MCH: 29.1 pg (ref 26.0–34.0)
MCHC: 33.4 g/dL (ref 30.0–36.0)
MCV: 86.9 fL (ref 78.0–100.0)
MPV: 11.5 fL (ref 8.6–12.4)
Monocytes Absolute: 0.9 10*3/uL (ref 0.1–1.0)
Monocytes Relative: 8 % (ref 3–12)
NEUTROS ABS: 6.8 10*3/uL (ref 1.7–7.7)
NEUTROS PCT: 59 % (ref 43–77)
PLATELETS: 331 10*3/uL (ref 150–400)
RBC: 4.44 MIL/uL (ref 3.87–5.11)
RDW: 13.9 % (ref 11.5–15.5)
WBC: 11.6 10*3/uL — ABNORMAL HIGH (ref 4.0–10.5)

## 2014-08-02 LAB — POCT URINE PREGNANCY: Preg Test, Ur: NEGATIVE

## 2014-08-02 MED ORDER — PROMETHAZINE HCL 25 MG PO TABS: ORAL_TABLET | ORAL | Status: DC

## 2014-08-02 MED ORDER — NORETHINDRONE ACET-ETHINYL EST 1.5-30 MG-MCG PO TABS: ORAL_TABLET | ORAL | Status: DC

## 2014-08-02 NOTE — Progress Notes (Signed)
Subjective:     Patient ID: Kristine [name], female   DOB: Feb 27, 1986, 27 y.o.   MRN: [id number]  HPI 29 yo G0 SAA F here for four days of heavy bleeding.  Flow does last about 10-14 days.  LMP 07/30/14.  Pt reports last three months (cycles) have been normal and on time but in past pt reports has skipped cycles for up to 3-4 months.  This cycles started off normally but heavy and doesn't seem to have improved for pt.  She was feeling anxious about it this morning and called to be seen.  Has felt some fatigue and possible SOB with exertion.  No palpitations or chest pain.  No light headedness.  Denies no trouble with bruising or clotting   Review of Systems  Constitutional: Positive for fatigue.  Cardiovascular: Negative for chest pain and palpitations.  Endocrine: Negative for cold intolerance.  Genitourinary: Positive for vaginal bleeding and menstrual problem. Negative for dysuria, vaginal discharge and vaginal pain.  Skin: Negative for pallor.      Objective:   Physical Exam  Constitutional: She is oriented to person, place, and time. She appears well-developed and well-nourished.  Cardiovascular: Normal rate and regular rhythm.   Pulmonary/Chest: Effort normal.  Abdominal: Soft. Bowel sounds are normal. She exhibits no distension. There is no tenderness. There is no rebound and no guarding. Hernia confirmed negative in the right inguinal area and confirmed negative in the left inguinal area.  Genitourinary: Uterus normal. There is no rash, tenderness or lesion on the right labia. There is no rash, tenderness or lesion on the left labia. Uterus is not enlarged. Right adnexum displays no mass and no tenderness. Left adnexum displays no mass and no tenderness. There is bleeding (moderate amt in vault) in the vagina.  Lymphadenopathy:       Right: No inguinal adenopathy present.       Left: No inguinal adenopathy present.  Neurological: She is alert and oriented to person, place, and  time.  Skin: Skin is warm and dry.  Psychiatric: She has a normal mood and affect.   Hb 13.3 with fingerstick UPT neg    Assessment:     Menorrhagia  Smoker    Plan:     CBC with differential.  Noted elevated WBC ct on prior CBC without any follow up.  Will have a differential done as well. OCP taper started.  3 pills x 3 days, then 2 x 2 days, then daily.  DVT, PE, headache, nausea risks d/w pt.  Pt knows to call with any issues or if bleeding doesn't improve.  Pt will continue OCPs through entire second pack.  Pt aware she does have increased risks due to smoking and states she won't smoke while on taper. Phenergan rx to pharmacy

## 2014-08-02 NOTE — Telephone Encounter (Signed)
Call to patient to follow up [place]. Patient states that she started her cycle Monday and has had "intense cramping and heavy bleeding." Patient is wearing 2 overnight pads and is changing every 3-4 hours. "I am having to change them because they are completely full from top to bottom. This morning when I woke up when I stood up there was a huge rush of blood that ran down my leg and onto the floor. When I got into the shower it was still pouring out of me." Patient denies any current pain. "I was having cramping 10-15 minutes ago." Patient feels increasingly fatigued and short of breath. "I have to take a lot of breaks because I am so tired." Advised patient will need to speak with covering provider Dr.Miller regarding appointment and scheduling. Patient is agreeable. Patient can come in today.

## 2014-08-02 NOTE — Telephone Encounter (Signed)
Spoke with patient. Advised spoke with Dr.Miller and she recommends patient come in today to be seen for evaluation. Patient aware she will be a work in patient and it may take a little while to be seen. Patient is agreeable. Patient is at work and needs to call someone to cover her shift. States she will be her no later than 2pm. Appointment scheduled for 2pm with Dr.Miller as work in.  Routing to provider for final review. Patient agreeable to disposition. Will close encounter

## 2014-08-02 NOTE — Patient Instructions (Signed)
Take three pills daily (all at once or one with each meal) for three days.  Your bleeding should be completely stopped within the first days.  Then take two pills daily for two more days.    Then you will just take one pill daily.  (this would be the normal dose).  You will skip the placebo pills and go straight into another pack.  Finish this pack normally and do not skip placebo pills.

## 2014-08-05 ENCOUNTER — Encounter: Payer: Self-pay | Admitting: Obstetrics & Gynecology

## 2014-08-06 ENCOUNTER — Telehealth: Payer: Self-pay | Admitting: Obstetrics & Gynecology

## 2014-08-06 NOTE — Telephone Encounter (Signed)
Spoke with patient. Patient states that on 1/21 she started taking birth control pills prescribed by Dr.Miller due to bleeding. Took 3 pills 1/21, 1/22, and 1/23. Took two pills 1/24 and will take two pills tonight. Patient states that Friday-Saturday she continued to have heavy bleeding with clotting. Sunday bleeding decreased slightly. Today patient is having light bleeding with no cramping. "I notice the bleeding when I wipe. I am wearing a pad just in case but it has been clean so far. I know she mentioned it should stop the bleeding after a day so I just wanted to check." Patient states pack she picked up is a 21 day pill pack of Junel. "Dr.Miller told me to skip the last week of the pack which would be the fourth week but there is not one. What do I do?" Advised patient will not need to skip week as there are no placebos in pack. Will continue right over to next pill pack. Patient is agreeable and verbalizes understanding. Advised will speak with Dr.Miller regarding bleeding and OCP and return call. Patient is agreeable.

## 2014-08-06 NOTE — Telephone Encounter (Signed)
Patient calling to report she is still bleeding.

## 2014-08-06 NOTE — Telephone Encounter (Signed)
Since it is better, keep doing what she is doing.  Also, her CBC is back and her WBC ct is still elevated.  Pt needs referral to Dr. Myna HidalgoEnnever.  I will place referral.  Should have follow up with me 2-3 weeks, if possible.  Thanks.

## 2014-08-06 NOTE — Telephone Encounter (Signed)
Spoke with patient. Advised patient of message as seen below from Dr.Miller. Patient is agreeable and verbalizes understanding. Appointment schedule for 2/15 at 2:30pm with Dr.Miller for follow up. Patient aware will hear from referrals coordinator in our office or Dr.Ennever's office in regards to scheduling appointment for elevated WBC count. Patient is agreeable.  Routing to provider for final review. Patient agreeable to disposition. Will close encounter

## 2014-08-14 ENCOUNTER — Encounter: Payer: Self-pay | Admitting: Obstetrics & Gynecology

## 2014-08-14 DIAGNOSIS — D72829 Elevated white blood cell count, unspecified: Secondary | ICD-10-CM

## 2014-08-14 NOTE — Telephone Encounter (Signed)
Dr. Hyacinth MeekerMiller, can you review and advise.  Patient bleeding again after taper, completed first pack. Started 1/21 with taper and bled until 08/06/14. No bleeding again until yesterday 08/13/14. States spotting only on tissue with wiping and on some spotting on pad. No heavy bleeding.  Advised patient since having spotting only, okay to continue to monitor, may be having spotting related to tapering down on birth control pills. Continue with pill daily as directed.  Advised patient to return call with any increase in bleeding or heavy bleeding. Advised Dr. Hyacinth MeekerMiller will review message and would return call if any changes to plan.   Referral entered for Dr. Myna HidalgoEnnever.  Advised patient expect to hear from their office directly in approximately two weeks, Advised Dr. Myna HidalgoEnnever is hematologist/oncology and will evaluate for reason for elevated WBC.

## 2014-08-15 NOTE — Telephone Encounter (Signed)
Spoke with patient and message from Dr. Hyacinth MeekerMiller. Patient states she continues to have light spotting only. No heavy bleeding. Will return call with any increase in bleeding and confirmed appointment for follow up.

## 2014-08-15 NOTE — Telephone Encounter (Signed)
As bleeding isn't heavy, just stay on pills. Hematologist is because she has a persistent elevated WBC ct. Pt had appt with me in about two weeks. So just stay on the pills and don't increase amt she is taking without calling back to us. If bleeding increases to more like a cycle, I want her to let us know.       MSM

## 2014-08-27 ENCOUNTER — Encounter: Payer: Self-pay | Admitting: Obstetrics & Gynecology

## 2014-08-27 ENCOUNTER — Ambulatory Visit (INDEPENDENT_AMBULATORY_CARE_PROVIDER_SITE_OTHER): Payer: PRIVATE HEALTH INSURANCE | Admitting: Obstetrics & Gynecology

## 2014-08-27 VITALS — BP 138/98 | HR 72 | Resp 16 | Wt 200.4 lb

## 2014-08-27 DIAGNOSIS — N921 Excessive and frequent menstruation with irregular cycle: Secondary | ICD-10-CM

## 2014-08-27 DIAGNOSIS — R591 Generalized enlarged lymph nodes: Secondary | ICD-10-CM

## 2014-08-27 NOTE — Progress Notes (Signed)
Subjective:     Patient ID: Kristine [name], female   DOB: 1986-02-15, 29 y.o.   MRN: [id number]  HPI 29 yo G0 SAA here for follow-up after episode of menorrhagia.  Pt started on OCP taper.  It took 3 days from the start of the taper for the bleeding to stop.  Then cycle restarted 08/14/13 and lasted through 2/12.  Flow wasn't heavy during any of this time.  It just wouldn't stop.  Pt is aware BP is elevated today.    Saw Dr. Bosie ClosSchooler and was diagnosed with pancreatisis?  Pt states she was started on "a pill" --Creon--that she took three of with every meal and snack.  This gave her diarrhea and emesis so she stopped.  She has been referred to Dr. Sherene SiresWert by Dr. Bosie ClosSchooler.  That appt is scheduled for 09/10/14.  She is not sure why she is going.  The referral notes states "concerns for sarcoidosis".  Reviewed all prior imaging with her.  CT from 05/15/14:  "Multiple enlarged retroperitoneal, porta hepatic and mesenteric lymph nodes are demonstrated. Reference right lower quadrant mesenteric lymph node measures 3.6 x 2.0 cm (image 58; series 5). Reference left para-aortic lymph node measures 1.6 x 0.9 cm (image 32; series 2). There is a 2.2 cm porta hepatic lymph node (image 27; series 2)."  Infectious and neoplastic process were to be considered.    No other notes are in EPIC except 08/02/14 visit with me and today.  Pt has been referred to Dr. Myna HidalgoEnnever for evaluation of mildly elevated WBC ct.  Hopefully blood smear will be done day of visit to r/o above.    Review of Systems  Constitutional: Negative.   Gastrointestinal: Positive for vomiting (on medication) and diarrhea.  Genitourinary: Positive for dysuria, vaginal bleeding and menstrual problem. Negative for urgency.       Objective:   Physical Exam  Constitutional: She appears well-developed and well-nourished.  Cardiovascular: Normal rate and regular rhythm.   Pulmonary/Chest: Effort normal and breath sounds normal.  Abdominal: Soft.  Bowel sounds are normal. She exhibits no distension and no mass. There is no tenderness. There is no rebound and no guarding.  Genitourinary: Vagina normal and uterus normal. There is no rash, tenderness, lesion or injury on the right labia. There is no rash, tenderness, lesion or injury on the left labia. Cervix exhibits no motion tenderness and no discharge. Right adnexum displays no mass and no tenderness. Left adnexum displays no mass and no tenderness.  Lymphadenopathy:       Right: No inguinal adenopathy present.       Left: No inguinal adenopathy present.  Skin: Skin is warm and dry.  Psychiatric: She has a normal mood and affect.       Assessment:     Menorrhagia with irregular cycles, much improved on OCPs Elevated BP on current OCP  Pancreatitis Mildly elevated WBC ct Lymphadenopathy noted on CT 11/15    Plan:     Pt aware of BP issues.  As I don't have a great grasp of what is going on her from GI standpoint, release of records signed.   Pt will need to switch from current OCP to non-estrogen OCP.  Is seeing Emeline GinsSarah [place], [place], hematology/oncology in two days.  ~30 minutes spent with patient >50% of time was in face to face discussion of above.

## 2014-08-28 ENCOUNTER — Telehealth: Payer: Self-pay | Admitting: Hematology & Oncology

## 2014-08-28 NOTE — Telephone Encounter (Addendum)
Left vm w NEW PATIENT today to remind them of their appointment with Dr. Ennever. Also, advised them to bring all medication bottles and insurance card information. ° °

## 2014-08-29 ENCOUNTER — Other Ambulatory Visit (HOSPITAL_BASED_OUTPATIENT_CLINIC_OR_DEPARTMENT_OTHER): Payer: PRIVATE HEALTH INSURANCE | Admitting: Lab

## 2014-08-29 ENCOUNTER — Ambulatory Visit (HOSPITAL_BASED_OUTPATIENT_CLINIC_OR_DEPARTMENT_OTHER): Payer: PRIVATE HEALTH INSURANCE | Admitting: Family

## 2014-08-29 ENCOUNTER — Ambulatory Visit: Payer: No Typology Code available for payment source

## 2014-08-29 ENCOUNTER — Encounter: Payer: Self-pay | Admitting: Family

## 2014-08-29 VITALS — BP 138/94 | HR 76 | Temp 98.4°F | Resp 14 | Ht 64.0 in | Wt 200.0 lb

## 2014-08-29 DIAGNOSIS — D72829 Elevated white blood cell count, unspecified: Secondary | ICD-10-CM | POA: Diagnosis not present

## 2014-08-29 DIAGNOSIS — K861 Other chronic pancreatitis: Secondary | ICD-10-CM | POA: Diagnosis not present

## 2014-08-29 DIAGNOSIS — Z720 Tobacco use: Secondary | ICD-10-CM | POA: Diagnosis not present

## 2014-08-29 LAB — CBC WITH DIFFERENTIAL (CANCER CENTER ONLY)
BASO#: 0 10*3/uL (ref 0.0–0.2)
BASO%: 0.3 % (ref 0.0–2.0)
EOS ABS: 0.3 10*3/uL (ref 0.0–0.5)
EOS%: 2.7 % (ref 0.0–7.0)
HCT: 38.1 % (ref 34.8–46.6)
HGB: 12.8 g/dL (ref 11.6–15.9)
LYMPH#: 3.1 10*3/uL (ref 0.9–3.3)
LYMPH%: 28.2 % (ref 14.0–48.0)
MCH: 29.6 pg (ref 26.0–34.0)
MCHC: 33.6 g/dL (ref 32.0–36.0)
MCV: 88 fL (ref 81–101)
MONO#: 1.1 10*3/uL — AB (ref 0.1–0.9)
MONO%: 9.7 % (ref 0.0–13.0)
NEUT%: 59.1 % (ref 39.6–80.0)
NEUTROS ABS: 6.4 10*3/uL (ref 1.5–6.5)
PLATELETS: 278 10*3/uL (ref 145–400)
RBC: 4.33 10*6/uL (ref 3.70–5.32)
RDW: 13.8 % (ref 11.1–15.7)
WBC: 10.9 10*3/uL — AB (ref 3.9–10.0)

## 2014-08-29 LAB — CHCC SATELLITE - SMEAR

## 2014-08-29 NOTE — Patient Instructions (Signed)
Smoking Cessation Quitting smoking is important to your health and has many advantages. However, it is not always easy to quit since nicotine is a very addictive drug. Oftentimes, people try 3 times or more before being able to quit. This document explains the best ways for you to prepare to quit smoking. Quitting takes hard work and a lot of effort, but you can do it. ADVANTAGES OF QUITTING SMOKING  You will live longer, feel better, and live better.  Your body will feel the impact of quitting smoking almost immediately.  Within 20 minutes, blood pressure decreases. Your pulse returns to its normal level.  After 8 hours, carbon monoxide levels in the blood return to normal. Your oxygen level increases.  After 24 hours, the chance of having a heart attack starts to decrease. Your breath, hair, and body stop smelling like smoke.  After 48 hours, damaged nerve endings begin to recover. Your sense of taste and smell improve.  After 72 hours, the body is virtually free of nicotine. Your bronchial tubes relax and breathing becomes easier.  After 2 to 12 weeks, lungs can hold more air. Exercise becomes easier and circulation improves.  The risk of having a heart attack, stroke, cancer, or lung disease is greatly reduced.  After 1 year, the risk of coronary heart disease is cut in half.  After 5 years, the risk of stroke falls to the same as a nonsmoker.  After 10 years, the risk of lung cancer is cut in half and the risk of other cancers decreases significantly.  After 15 years, the risk of coronary heart disease drops, usually to the level of a nonsmoker.  If you are pregnant, quitting smoking will improve your chances of having a healthy baby.  The people you live with, especially any children, will be healthier.  You will have extra money to spend on things other than cigarettes. QUESTIONS TO THINK ABOUT BEFORE ATTEMPTING TO QUIT You may want to talk about your answers with your  health care provider.  Why do you want to quit?  If you tried to quit in the past, what helped and what did not?  What will be the most difficult situations for you after you quit? How will you plan to handle them?  Who can help you through the tough times? Your family? Friends? A health care provider?  What pleasures do you get from smoking? What ways can you still get pleasure if you quit? Here are some questions to ask your health care provider:  How can you help me to be successful at quitting?  What medicine do you think would be best for me and how should I take it?  What should I do if I need more help?  What is smoking withdrawal like? How can I get information on withdrawal? GET READY  Set a quit date.  Change your environment by getting rid of all cigarettes, ashtrays, matches, and lighters in your home, car, or work. Do not let people smoke in your home.  Review your past attempts to quit. Think about what worked and what did not. GET SUPPORT AND ENCOURAGEMENT You have a better chance of being successful if you have help. You can get support in many ways.  Tell your family, friends, and coworkers that you are going to quit and need their support. Ask them not to smoke around you.  Get individual, group, or telephone counseling and support. Programs are available at local hospitals and health centers. Call   your local health department for information about programs in your area.  Spiritual beliefs and practices may help some smokers quit.  Download a "quit meter" on your computer to keep track of quit statistics, such as how long you have gone without smoking, cigarettes not smoked, and money saved.  Get a self-help book about quitting smoking and staying off tobacco. LEARN NEW SKILLS AND BEHAVIORS  Distract yourself from urges to smoke. Talk to someone, go for a walk, or occupy your time with a task.  Change your normal routine. Take a different route to work.  Drink tea instead of coffee. Eat breakfast in a different place.  Reduce your stress. Take a hot bath, exercise, or read a book.  Plan something enjoyable to do every day. Reward yourself for not smoking.  Explore interactive web-based programs that specialize in helping you quit. GET MEDICINE AND USE IT CORRECTLY Medicines can help you stop smoking and decrease the urge to smoke. Combining medicine with the above behavioral methods and support can greatly increase your chances of successfully quitting smoking.  Nicotine replacement therapy helps deliver nicotine to your body without the negative effects and risks of smoking. Nicotine replacement therapy includes nicotine gum, lozenges, inhalers, nasal sprays, and skin patches. Some may be available over-the-counter and others require a prescription.  Antidepressant medicine helps people abstain from smoking, but how this works is unknown. This medicine is available by prescription.  Nicotinic receptor partial agonist medicine simulates the effect of nicotine in your brain. This medicine is available by prescription. Ask your health care provider for advice about which medicines to use and how to use them based on your health history. Your health care provider will tell you what side effects to look out for if you choose to be on a medicine or therapy. Carefully read the information on the package. Do not use any other product containing nicotine while using a nicotine replacement product.  RELAPSE OR DIFFICULT SITUATIONS Most relapses occur within the first 3 months after quitting. Do not be discouraged if you start smoking again. Remember, most people try several times before finally quitting. You may have symptoms of withdrawal because your body is used to nicotine. You may crave cigarettes, be irritable, feel very hungry, cough often, get headaches, or have difficulty concentrating. The withdrawal symptoms are only temporary. They are strongest  when you first quit, but they will go away within 10-14 days. To reduce the chances of relapse, try to:  Avoid drinking alcohol. Drinking lowers your chances of successfully quitting.  Reduce the amount of caffeine you consume. Once you quit smoking, the amount of caffeine in your body increases and can give you symptoms, such as a rapid heartbeat, sweating, and anxiety.  Avoid smokers because they can make you want to smoke.  Do not let weight gain distract you. Many smokers will gain weight when they quit, usually less than 10 pounds. Eat a healthy diet and stay active. You can always lose the weight gained after you quit.  Find ways to improve your mood other than smoking. FOR MORE INFORMATION  www.smokefree.gov  Document Released: 06/23/2001 Document Revised: 11/13/2013 Document Reviewed: 10/08/2011 Exit[employer] Patient Information 2015 [employer]. This information is not intended to replace advice given to you by your health care provider. Make sure you discuss any questions you have with your health care provider.  

## 2014-08-29 NOTE — Progress Notes (Signed)
Hematology/Oncology Consultation   Name: Kristine [name]      MRN: [id number]    Location: Room/bed info not found  Date: 08/29/2014 Time:4:08 PM   REFERRING PHYSICIAN: Jerene Bears  REASON FOR CONSULT: Elevated WBC count   DIAGNOSIS:  1. Elevated WBC count  2. Chronic pancreatitis   HISTORY OF PRESENT ILLNESS: Kristine [name] is a very pleasant 29 yo African American female with a recent mildly elevated WBC count (11.6-12.5). WBC count today is 10.9.  She has heavy, irregular cycles. GYN started her on birth control last month to help regulate her.  She has chronic pancreatitis and is unsure what caused it. She was on Creon but had to stop it because of side effects. She has right sided abdominal pain around 3 times daily usually after meals. She is nauseated at times. She takes phenergan for this. It makes her dizzy sometimes but does help with the nausea.  She has also had issues with diarrhea and one episode of incontinence. She has had some blood in her stool. She states that she recently had a colonoscopy with biopsies that were normal.  She has no children and has never been pregnant.  She has been a smoker for the past 6 years.  She has no problem with infections. She denies fever, chills, n/v, cough, rash, dizziness, SOB, chest pain, constipation, diarrhea, blood in urine.  No swelling, tenderness, numbness or tingling in her extremities. No new aches or pains.  Her appetite is good and she is drinking plenty of fluids. Her weight is stable.  No personal or familial history of anemia, sickle cell, bleeding or clotting disorders or cancer.  She works as a Electrical engineer here in town.   ROS: All other 10 point review of systems is negative.   PAST MEDICAL HISTORY:   Past Medical History  Diagnosis Date  . Dyspareunia   . STD (sexually transmitted disease) 2012    Tx' d for Chlamydia    ALLERGIES: Allergies  Allergen Reactions  . Macrobid [Nitrofurantoin Macrocrystal]      Abdominal cramping and nausea.      MEDICATIONS:  Current Outpatient Prescriptions on File Prior to Visit  Medication Sig Dispense Refill  . Norethindrone Acetate-Ethinyl Estradiol (LOESTRIN 1.5/30, 21,) 1.5-30 MG-MCG tablet Pt on pills taper for menorrhagia.  Needs two packs for first month due to taper. 2 Package 5  . OVER THE COUNTER MEDICATION Take 1 tablet by mouth at bedtime as needed (fpor sleep. [employer] brand sleep aid).    . promethazine (PHENERGAN) 25 MG tablet 1/2 to 1 tablet every six hours as needed for nausea 20 tablet 0   No current facility-administered medications on file prior to visit.     PAST SURGICAL HISTORY Past Surgical History  Procedure Laterality Date  . Breast surgery  2000    benign biopsy    FAMILY HISTORY: No family history on file.  SOCIAL HISTORY:  reports that she has been smoking Cigarettes.  She started smoking about 6 years ago. She has a 1.5 pack-year smoking history. She has never used smokeless tobacco. She reports that she does not drink alcohol or use illicit drugs.  PERFORMANCE STATUS: The patient's performance status is 0 - Asymptomatic  PHYSICAL EXAM: Most Recent Vital Signs: Blood pressure 138/94, pulse 76, temperature 98.4 F (36.9 C), temperature source Oral, resp. rate 14, height  (1.626 m), weight 200 lb (90.719 kg), last menstrual period 07/30/2014. BP 138/94 mmHg  Pulse 76  Temp(Src)  98.4 F (36.9 C) (Oral)  Resp 14  Ht 5\' 4"  (1.626 m)  Wt 200 lb (90.719 kg)  BMI 34.31 kg/m2  LMP 07/30/2014  General Appearance:    Alert, cooperative, no distress, appears stated age  Head:    Normocephalic, without obvious abnormality, atraumatic  Eyes:    PERRL, conjunctiva/corneas clear, EOM's intact, fundi    benign, both eyes        Throat:   Lips, mucosa, and tongue normal; teeth and gums normal  Neck:   Supple, symmetrical, trachea midline, no adenopathy;    thyroid:  no enlargement/tenderness/nodules; no  carotid   bruit or JVD  Back:     Symmetric, no curvature, ROM normal, no CVA tenderness  Lungs:     Clear to auscultation bilaterally, respirations unlabored  Chest Wall:    No tenderness or deformity   Heart:    Regular rate and rhythm, S1 and S2 normal, no murmur, rub   or gallop     Abdomen:     Soft, non-tender, bowel sounds active all four quadrants,    no masses, no organomegaly        Extremities:   Extremities normal, atraumatic, no cyanosis or edema  Pulses:   2+ and symmetric all extremities  Skin:   Skin color, texture, turgor normal, no rashes or lesions  Lymph nodes:   Cervical, supraclavicular, and axillary nodes normal  Neurologic:   CNII-XII intact, normal strength, sensation and reflexes    throughout   LABORATORY DATA:  Results for orders placed or performed in visit on 08/29/14 (from the past 48 hour(s))  CBC with Differential [facility])     Status: Abnormal   Collection Time: 08/29/14  2:00 PM  Result Value Ref Range   WBC 10.9 (H) 3.9 - 10.0 10e3/uL   RBC 4.33 3.70 - 5.32 10e6/uL   HGB 12.8 11.6 - 15.9 g/dL   HCT 81.138.1 91.434.8 - 78.246.6 %   MCV 88 81 - 101 fL   MCH 29.6 26.0 - 34.0 pg   MCHC 33.6 32.0 - 36.0 g/dL   RDW 95.613.8 21.311.1 - 08.615.7 %   Platelets 278 145 - 400 10e3/uL   NEUT# 6.4 1.5 - 6.5 10e3/uL   LYMPH# 3.1 0.9 - 3.3 10e3/uL   MONO# 1.1 (H) 0.1 - 0.9 10e3/uL   Eosinophils Absolute 0.3 0.0 - 0.5 10e3/uL   BASO# 0.0 0.0 - 0.2 10e3/uL   NEUT% 59.1 39.6 - 80.0 %   LYMPH% 28.2 14.0 - 48.0 %   MONO% 9.7 0.0 - 13.0 %   EOS% 2.7 0.0 - 7.0 %   BASO% 0.3 0.0 - 2.0 %  [facility] - Smear     Status: None   Collection Time: 08/29/14  2:00 PM  Result Value Ref Range   Smear Result Smear Available       RADIOGRAPHY: No results found.     PATHOLOGY: None  ASSESSMENT/PLAN: Kristine [name] is a very pleasant 29 yo African American female with a recent mildly elevated WBC count (11.6-12.5). She has chronic pancreatitis and is a smoker. She is  asymptomatic at this time.  WBC count today is 10.9 and her differential was normal. Dr. Myna HidalgoEnnever reviewed her blood smear and saw no abnormality or evidence of malignancy.  We do not need to see her back in our office. All questions today were answered.  She knows to call back with any other questions or concerns. We can certainly see her for any  other hematologic issues in the future,  The patient was discussed with Dr. Myna Hidalgo and he is in agreement with the aforementioned.   [facility]

## 2014-09-02 ENCOUNTER — Encounter: Payer: Self-pay | Admitting: Obstetrics & Gynecology

## 2014-09-02 DIAGNOSIS — R591 Generalized enlarged lymph nodes: Secondary | ICD-10-CM | POA: Insufficient documentation

## 2014-09-03 ENCOUNTER — Telehealth: Payer: Self-pay | Admitting: Hematology & Oncology

## 2014-09-03 ENCOUNTER — Encounter: Payer: Self-pay | Admitting: Obstetrics & Gynecology

## 2014-09-03 ENCOUNTER — Other Ambulatory Visit: Payer: Self-pay | Admitting: Family

## 2014-09-03 DIAGNOSIS — R591 Generalized enlarged lymph nodes: Secondary | ICD-10-CM

## 2014-09-03 MED ORDER — NORETHINDRONE 0.35 MG PO TABS: 1.0000 | ORAL_TABLET | Freq: Every day | ORAL | Status: DC

## 2014-09-03 NOTE — Telephone Encounter (Signed)
Pt is awre of 2-25 CT to drink contrast and be NPO 4 hrs. I sent Delice Bisonara inbasket to precert

## 2014-09-03 NOTE — Telephone Encounter (Signed)
Please contact the patient and have her switch to [facility] oral contraceptive pills at the end of her current pack of pills. She will need to understand that this pill is a totally different kind of pill, and that she will need to take it daily at the same time every day. She will need to used back up protection for the first month.       Please send an Rx to patient's pharmacy of choice for Camilla, one month and one refill .       Patient will need an annual exam in 6 weeks before the Revereamilla runs out.       Thank you.

## 2014-09-03 NOTE — Telephone Encounter (Signed)
Spoke with patient. She is at the end of her second pack of Loestrin 1.5/30. She states that bleeding has improved greatly. Only spotting intermittently. Patient denies complaints. At last office visit patient had elevated BP and per patient she was to call back after visit with Heme/Onc, as Dr. Hyacinth MeekerMiller may change pills.   Advised patient, would route message to covering provider, Dr. Edward JollySilva to review and advise.

## 2014-09-03 NOTE — Telephone Encounter (Signed)
Patient advised of message from Dr. Edward JollySilva and instructions regarding starting progesterone only pills given. She verbalized understanding of all instructions. She is scheduled for follow up with Dr.Miller in 6 weeks. Routing to provider for final review. Patient agreeable to disposition. Will close encounter

## 2014-09-06 ENCOUNTER — Encounter (HOSPITAL_BASED_OUTPATIENT_CLINIC_OR_DEPARTMENT_OTHER): Payer: Self-pay

## 2014-09-06 ENCOUNTER — Other Ambulatory Visit: Payer: Self-pay | Admitting: Family

## 2014-09-06 ENCOUNTER — Ambulatory Visit (HOSPITAL_BASED_OUTPATIENT_CLINIC_OR_DEPARTMENT_OTHER)
Admission: RE | Admit: 2014-09-06 | Discharge: 2014-09-06 | Disposition: A | Discharge status: Home / Self Care | Payer: PRIVATE HEALTH INSURANCE | Source: Ambulatory Visit | Attending: Family | Admitting: Family

## 2014-09-06 DIAGNOSIS — K859 Acute pancreatitis, unspecified: Secondary | ICD-10-CM | POA: Insufficient documentation

## 2014-09-06 DIAGNOSIS — R599 Enlarged lymph nodes, unspecified: Secondary | ICD-10-CM | POA: Insufficient documentation

## 2014-09-06 DIAGNOSIS — R591 Generalized enlarged lymph nodes: Secondary | ICD-10-CM

## 2014-09-06 MED ORDER — IOHEXOL 300 MG/ML  SOLN
100.0000 mL | Freq: Once | INTRAMUSCULAR | Status: AC | PRN
  Administered 2014-09-06: 100 mL via INTRAVENOUS

## 2014-09-10 ENCOUNTER — Institutional Professional Consult (permissible substitution): Payer: Self-pay | Admitting: Internal Medicine

## 2014-10-16 ENCOUNTER — Ambulatory Visit (INDEPENDENT_AMBULATORY_CARE_PROVIDER_SITE_OTHER): Payer: PRIVATE HEALTH INSURANCE | Admitting: Obstetrics & Gynecology

## 2014-10-16 ENCOUNTER — Encounter: Payer: Self-pay | Admitting: Obstetrics & Gynecology

## 2014-10-16 ENCOUNTER — Other Ambulatory Visit: Payer: Self-pay | Admitting: Obstetrics & Gynecology

## 2014-10-16 VITALS — BP 112/78 | HR 60 | Resp 16 | Wt 199.8 lb

## 2014-10-16 DIAGNOSIS — D869 Sarcoidosis, unspecified: Secondary | ICD-10-CM | POA: Diagnosis not present

## 2014-10-16 DIAGNOSIS — T384X5D Adverse effect of oral contraceptives, subsequent encounter: Secondary | ICD-10-CM | POA: Diagnosis not present

## 2014-10-16 DIAGNOSIS — Z202 Contact with and (suspected) exposure to infections with a predominantly sexual mode of transmission: Secondary | ICD-10-CM

## 2014-10-16 DIAGNOSIS — N921 Excessive and frequent menstruation with irregular cycle: Secondary | ICD-10-CM | POA: Diagnosis not present

## 2014-10-16 DIAGNOSIS — T384X5A Adverse effect of oral contraceptives, initial encounter: Secondary | ICD-10-CM | POA: Insufficient documentation

## 2014-10-16 MED ORDER — NORETHINDRONE 0.35 MG PO TABS
1.0000 | ORAL_TABLET | Freq: Every day | ORAL | Status: DC
Start: 2014-10-16 — End: 2015-04-16

## 2014-10-16 NOTE — Progress Notes (Signed)
Patient ID: Kristine [name], female   DOB: 1986/03/16, 29 y.o.   MRN: [id number]  29 yo G0 SAAF here for follow up after starting micronor.  Last two months, her flow has been normal.  Flow lasted 7 days and first two days were heavier but not like when I saw pt in January.  She is very pleased with this.  As well, blood pressure has normalized.  She denies headache or nausea.  Feeling well with this pill and feels like she will continue for a few months.  She does have the desire to be pregnant, so asks how long I would recommend she take it.  I reviewed referral and specialist appts pt has recently had.  [facility] [place].  CT was repeated due to lymphadenopathy.  This lymphadenopathy was stable and lymphoproliferative disorders were ruled out.    Reports her stools have normalized.  She did have a colonoscopy as well as endoscopy with Dr. Bosie ClosSchooler.  I have this result but not the biopsy results.  Pt reports biopsies were negative.  Pt did not, however, see pulmonologist.  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  D/w pt what this diagnosis is/means and why it is important to have pulmonology involved.  Advised after that visit, if she desires to stop OCPs and start trying for pregnancy, then that would be fine.  Advised she needs to be very clear when seen by pulmonologist she should discuss pregnancy desires.  Voices understanding.     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  Normal colonoscopy.  Plan:  Referral back to pulmonolgy Stay on micronor for now.  Rx to pharmacy. HIV obtained today.   ~15 minutes spent with patient >50% of time was in face to face discussion of above, specifically in regards to need for further evaluation and importance of  this.

## 2014-10-17 LAB — HIV ANTIBODY (ROUTINE TESTING W REFLEX): HIV 1&2 Ab, 4th Generation: NONREACTIVE

## 2014-10-18 ENCOUNTER — Telehealth: Payer: Self-pay

## 2014-10-18 NOTE — Telephone Encounter (Signed)
Lmtcb//kn 

## 2014-10-18 NOTE — Telephone Encounter (Signed)
-----   Message from Jerene BearsMary S Miller, MD sent at 10/17/2014 10:28 AM EDT ----- Inform pt this is negative.  I have put in an order for pulmonary consult.  If she hasn't heard back from us in two weeks, she needs to call back.  Message also sent through MyChart but I'm not sure she uses it.

## 2014-10-21 ENCOUNTER — Encounter: Payer: Self-pay | Admitting: Obstetrics & Gynecology

## 2014-10-22 ENCOUNTER — Encounter: Payer: Self-pay | Admitting: Obstetrics & Gynecology

## 2014-10-23 LAB — FOLLICLE STIMULATING HORMONE: FSH: 7.5 m[IU]/mL

## 2014-10-27 ENCOUNTER — Other Ambulatory Visit: Payer: Self-pay | Admitting: Obstetrics and Gynecology

## 2014-10-29 NOTE — Telephone Encounter (Signed)
Medication refill request: Camila  Last AEX:  05/08/13 Dr. Edward JollySilva last OV 10/16/14 SM Next AEX: not scheduled  Last MMG (if hormonal medication request): none Refill authorized: 10/16/14 #1pack/ 12 R to [facility] [address]

## 2014-10-30 ENCOUNTER — Encounter: Payer: Self-pay | Admitting: Emergency Medicine

## 2014-10-30 ENCOUNTER — Ambulatory Visit (INDEPENDENT_AMBULATORY_CARE_PROVIDER_SITE_OTHER): Payer: PRIVATE HEALTH INSURANCE | Admitting: Emergency Medicine

## 2014-10-30 ENCOUNTER — Encounter: Payer: Self-pay | Admitting: *Deleted

## 2014-10-30 VITALS — BP 108/68 | HR 116 | Ht 64.0 in | Wt 205.0 lb

## 2014-10-30 DIAGNOSIS — D869 Sarcoidosis, unspecified: Secondary | ICD-10-CM | POA: Diagnosis not present

## 2014-10-30 DIAGNOSIS — R591 Generalized enlarged lymph nodes: Secondary | ICD-10-CM | POA: Diagnosis not present

## 2014-10-30 NOTE — Progress Notes (Signed)
Subjective:    Patient ID: Kristine [name], female    DOB: 11/24/1985, 29 y.o.   MRN: [id number]  HPI 29 yo woman, active smoker, hx chronic pancreatitis and of abnormal CT scan abdomen first noted in November 2015 and then again in February 2016. This showed retroperitoneal periaortic and mesenteric lymphadenopathy. This was in the setting of chronic pancreatitis. There has been some question of sarcoidosis and she is referred to investigate this. I do not see any interstitial disease at her bases on CT scan of the chest. It is not possible to assess for mediastinal lymphadenopathy on the scans. Her main complaints are related to her chronic pancreatitis which has been evaluated by Dr. Bosie Clos. She does not have dyspnea except for with significant exertion, no cough, wheeze.   Review of Systems  Constitutional: Negative for fever and unexpected weight change.  HENT: Negative for congestion, dental problem, ear pain, nosebleeds, postnasal drip, rhinorrhea, sinus pressure, sneezing, sore throat and trouble swallowing.   Eyes: Negative for redness and itching.  Respiratory: Positive for shortness of breath. Negative for cough, chest tightness and wheezing.   Cardiovascular: Negative for palpitations and leg swelling.  Gastrointestinal: Positive for abdominal pain. Negative for nausea and vomiting.  Genitourinary: Negative for dysuria.  Musculoskeletal: Negative for joint swelling.  Skin: Negative for rash.  Neurological: Negative for headaches.  Hematological: Does not bruise/bleed easily.  Psychiatric/Behavioral: Negative for dysphoric mood. The patient is not nervous/anxious.    Past Medical History  Diagnosis Date  . Dyspareunia   . STD (sexually transmitted disease) 2012    Tx' d for Chlamydia     No family history on file.   History   Social History  . Marital Status: Single    Spouse Name: N/A  . Number of Children: N/A  . Years of Education: N/A   Occupational  History  . Not on file.   Social History Main Topics  . Smoking status: Current Every Day Smoker -- 0.50 packs/day for 3 years    Types: Cigarettes    Start date: 08/29/2008  . Smokeless tobacco: Never Used     Comment: 08-29-14 still smoking  . Alcohol Use: No  . Drug Use: No  . Sexual Activity:    Partners: Male    Birth Control/ Protection: None     Comment: trying for pregnancy   Other Topics Concern  . Not on file   Social History Narrative     Allergies  Allergen Reactions  . Macrobid [Nitrofurantoin Macrocrystal]     Abdominal cramping and nausea.     Outpatient Prescriptions Prior to Visit  Medication Sig Dispense Refill  . norethindrone (MICRONOR,CAMILA,ERRIN) 0.35 MG tablet Take 1 tablet (0.35 mg total) by mouth daily. 1 Package 12  . OVER THE COUNTER MEDICATION Take 1 tablet by mouth at bedtime as needed (fpor sleep. [employer] brand sleep aid).     No facility-administered medications prior to visit.       Objective:   Physical Exam Filed Vitals:   10/30/14 1412  BP: 108/68  Pulse: 116  Height:  (1.626 m)  Weight: 205 lb (92.987 kg)  SpO2: 98%   Gen: Pleasant, overwt woman, in no distress,  normal affect  ENT: No lesions,  mouth clear,  oropharynx clear, no postnasal drip  Neck: No JVD, no TMG, no carotid bruits  Lungs: No use of accessory muscles, clear without rales or rhonchi  Cardiovascular: RRR, heart sounds normal, no murmur or gallops, no  peripheral edema  Musculoskeletal: No deformities, no cyanosis or clubbing  Neuro: alert, non focal  Skin: Warm, no lesions or rashes    CT abd/pelvis 09/06/14 -- I reviewed the CT scans myself COMPARISON: 05/15/2014  FINDINGS: Lower chest: Volume loss and probable scarring in the inferior lingula. Heart size upper normal, without pericardial or pleural effusion. Similar borderline prominent periesophageal nodes.  Hepatobiliary: Normal liver. Normal gallbladder, without  biliary ductal dilatation.  Pancreas: Normal, without mass or ductal dilatation.  Spleen: Normal  Adrenals/Urinary Tract: Normal adrenal glands. Normal kidneys, without hydronephrosis. Normal urinary bladder.  Stomach/Bowel: Normal stomach, without wall thickening. Moderate stool burden involving the ascending and transverse colon. Solid stool like material in the terminal ileum. Normal appendix. Normal appearance of small bowel loops.  Vascular/Lymphatic: Normal caliber of the aorta and branch vessels. Extensive abdominal pelvic adenopathy again identified. Portacaval node measures 2.3 cm on image 24, similar.  1.6 x 1.0 cm left periaortic node is similar on image 30.  An ileocolic mesenteric node measures 2.3 x 1.1 cm on image 54 versus 2.2 x 1.2 cm on the prior, similar.  Reproductive: Normal uterus and adnexa.  Other: No significant free fluid.  Musculoskeletal: No acute osseous abnormality.  IMPRESSION: 1. Similar abdominal pelvic adenopathy. This remains indeterminate. Considerations include lymphoproliferative process such as lymphoma. Infectious or inflammatory process such as HIV or sarcoidosis could look similar. 2. Possible constipation. No other explanation for change in bowel habits. 3. No evidence of pancreatitis or its complications.     Assessment & Plan:  Lymphadenopathy Noted on CT scan of the abdomen and stable repeat CT from February 2016. She has a history of chronic pancreatitis and the lymphadenopathy may relate to this. Of course also in the differential diagnosis is a lymphoproliferative disorder or lymphoma which will need to be ruled out. Sarcoidosis is also a possibility. There is a paraesophageal node that may be reachable by EUS. I will ask Dr Christella HartiganJacobs to look at the scan and consider this.   With regard to the risk of sarcoidosis, if she does have the disease then she will need pulmonary surveillance. I will order a CT scan of her  chest to look for interstitial disease and lymphadenopathy. I will also order pulmonary function testing to rule out obstructive lung disease. She does have mediastinal lymphadenopathy or infiltrates then she may be a candidate for biopsy via bronchoscopy

## 2014-10-30 NOTE — Patient Instructions (Signed)
We will order a CT scan of your chest and pulmonary function testing We will refer you to see Dr Christella HartiganJacobs with gastroenterology to consider a procedure that would allow us to sample your enlarged lymph node Follow with Dr Delton CoombesByrum in 1 month

## 2014-10-30 NOTE — Assessment & Plan Note (Addendum)
Noted on CT scan of the abdomen and stable repeat CT from February 2016. She has a history of chronic pancreatitis and the lymphadenopathy may relate to this. Of course also in the differential diagnosis is a lymphoproliferative disorder or lymphoma which will need to be ruled out. Sarcoidosis is also a possibility. There is a paraesophageal node that may be reachable by EUS. I will ask Dr Christella HartiganJacobs to look at the scan and consider this.   With regard to the risk of sarcoidosis, if she does have the disease then she will need pulmonary surveillance. I will order a CT scan of her chest to look for interstitial disease and lymphadenopathy. I will also order pulmonary function testing to rule out obstructive lung disease. She does have mediastinal lymphadenopathy or infiltrates then she may be a candidate for biopsy via bronchoscopy

## 2014-11-01 ENCOUNTER — Encounter: Payer: Self-pay | Admitting: Obstetrics & Gynecology

## 2014-11-05 ENCOUNTER — Ambulatory Visit (INDEPENDENT_AMBULATORY_CARE_PROVIDER_SITE_OTHER): Payer: PRIVATE HEALTH INSURANCE | Admitting: Emergency Medicine

## 2014-11-05 DIAGNOSIS — D869 Sarcoidosis, unspecified: Secondary | ICD-10-CM | POA: Diagnosis not present

## 2014-11-05 LAB — PULMONARY FUNCTION TEST
DL/VA % pred: 91 %
DL/VA: 4.31 ml/min/mmHg/L
DLCO unc % pred: 65 %
DLCO unc: 15.46 ml/min/mmHg
FEF 25-75 Post: 5.53 L/sec
FEF 25-75 Pre: 5.1 L/sec
FEF2575-%Change-Post: 8 %
FEF2575-%Pred-Post: 167 %
FEF2575-%Pred-Pre: 154 %
FEV1-%Change-Post: 3 %
FEV1-%Pred-Post: 99 %
FEV1-%Pred-Pre: 95 %
FEV1-Post: 2.73 L
FEV1-Pre: 2.64 L
FEV1FVC-%Change-Post: 1 %
FEV1FVC-%Pred-Pre: 107 %
FEV6-%Change-Post: 1 %
FEV6-%Pred-Post: 91 %
FEV6-%Pred-Pre: 89 %
FEV6-Post: 2.89 L
FEV6-Pre: 2.84 L
FEV6FVC-%Pred-Post: 101 %
FEV6FVC-%Pred-Pre: 101 %
FVC-%Change-Post: 1 %
FVC-%Pred-Post: 90 %
FVC-%Pred-Pre: 89 %
FVC-Post: 2.89 L
FVC-Pre: 2.84 L
Post FEV1/FVC ratio: 94 %
Post FEV6/FVC ratio: 100 %
Pre FEV1/FVC ratio: 93 %
Pre FEV6/FVC Ratio: 100 %
RV % pred: 58 %
RV: 0.75 L
TLC % pred: 70 %
TLC: 3.52 L

## 2014-11-05 NOTE — Progress Notes (Signed)
PFT done today. 

## 2014-11-13 ENCOUNTER — Inpatient Hospital Stay: Admission: RE | Admit: 2014-11-13 | Payer: PRIVATE HEALTH INSURANCE | Source: Ambulatory Visit

## 2014-11-14 ENCOUNTER — Telehealth: Payer: Self-pay | Admitting: Emergency Medicine

## 2014-11-14 NOTE — Telephone Encounter (Signed)
Received records from [facility] sent to Dr. Delton CoombesByrum 11/14/14 fbg.

## 2014-11-15 ENCOUNTER — Telehealth: Payer: Self-pay | Admitting: Emergency Medicine

## 2014-11-15 NOTE — Telephone Encounter (Signed)
Rec'v records from [facility] sent to Dr. Delton CoombesByrum 11/15/14 fbg.

## 2014-11-15 NOTE — Telephone Encounter (Signed)
Created telephone encounter to call patient to obtain further triage.  Message left to return call to [place] at [phone].

## 2014-11-15 NOTE — Telephone Encounter (Signed)
RE: Non-Urgent Medical Question  Message [phone]   From Laretta Deshong   To Jerene BearsMary S Miller, MD    Sent 11/14/2014 4:55 PM   Hey Dr. Martyn MalayMiller   Hope all is well. I received a FMLA form from my job dealing with pancreatic that i was diagnosed with because i been missing so many days with doctors appointments that you referred me too. So im wondering do i bring the form to you guys or the GI doctor i was dealing with before you referred me to other specialist. Dr. Bosie ClosSchooler is the one who diagnosed me but i haven't seen him in a while. Just the ones you referred me too. Im just a little confused on who i should take the forms too.

## 2014-11-15 NOTE — Telephone Encounter (Signed)
Received records from [facility] sent to Dr. Delton CoombesByrum 11/15/14 fbg.

## 2014-11-15 NOTE — Telephone Encounter (Signed)
Chief Complaint  Patient presents with  . Advice Only    Patient sent mychart message.    Message left to return call to [place] at [phone].

## 2014-11-15 NOTE — Telephone Encounter (Signed)
Spoke with patient. She has forms from work for [employer] for missed days of work that have already occurred. Patient states she was seen in our office by Dr. Hyacinth MeekerMiller and has seen specialists in GI, Pulmonary and Heme/Onc.   She is not sure who should complete the forms that she has.  Patient unsure of what days she missed work and what days she will need coverage for FMLA for.   Advised patient usually the doctor who is treating her for chronic conditions will complete FMLA forms as needed. As this RN in unsure what forms are requiring and patient unsure as well, advised can bring forms to our office and they can be reviewed. Upon review can advise which provider we would suggest complete forms for her. Patient agreeable. She will come by on 5/6 to drop off a copy of forms.

## 2014-11-15 NOTE — Telephone Encounter (Signed)
Received records [facility] sent to Dr. Delton CoombesByrum 5/5/16fbg.

## 2014-11-16 NOTE — Telephone Encounter (Signed)
Thank you.  I'm happy to review them when she drops them off to see what we can can do help.

## 2014-11-16 NOTE — Telephone Encounter (Signed)
Patient dropped off the forms forms for review. Sending to Dr. Hyacinth MeekerMiller for review.

## 2014-11-17 NOTE — Telephone Encounter (Signed)
Just sending you this message as you as I think [place] brought pt's FMLA papers and we need specific dates from her job to see if we can sign her paperwork.  CC:  Tracy Fast.

## 2014-11-19 NOTE — Telephone Encounter (Signed)
Can you ask her what doctor has suggested she will need future time off?  That might help. Thanks.

## 2014-11-19 NOTE — Telephone Encounter (Signed)
Spoke with patient this morning. She states that she does not have specific dates, but is requesting FMLA for time off work going forward. Since we do not have specific dates from the patient, which provider do you suggest the patient send paperwork to?

## 2014-11-20 NOTE — Telephone Encounter (Signed)
Encounter closed.  Thanks for the clarification.

## 2014-11-20 NOTE — Telephone Encounter (Signed)
[name] said that her pulmonary provider mentioned that she may need to take some time off work if she has a procedure in the future. She will stop by to pick up her FMLA form and take those to that provider.

## 2014-11-21 ENCOUNTER — Other Ambulatory Visit: Payer: 59

## 2014-11-26 NOTE — Telephone Encounter (Signed)
Patient has seen results and contacted Dr Miller.//kn

## 2014-11-29 ENCOUNTER — Inpatient Hospital Stay: Admission: RE | Admit: 2014-11-29 | Payer: 59 | Source: Ambulatory Visit

## 2014-11-29 ENCOUNTER — Other Ambulatory Visit: Payer: 59

## 2014-12-03 ENCOUNTER — Ambulatory Visit: Payer: PRIVATE HEALTH INSURANCE | Admitting: Emergency Medicine

## 2014-12-06 ENCOUNTER — Inpatient Hospital Stay: Admission: RE | Admit: 2014-12-06 | Payer: 59 | Source: Ambulatory Visit

## 2014-12-06 ENCOUNTER — Telehealth: Payer: Self-pay | Admitting: Emergency Medicine

## 2014-12-06 DIAGNOSIS — R591 Generalized enlarged lymph nodes: Secondary | ICD-10-CM

## 2014-12-06 NOTE — Telephone Encounter (Signed)
Noted. Will forward to RB as FYI.

## 2014-12-06 NOTE — Telephone Encounter (Signed)
Thanks, ? Can we see if she [facility]?

## 2014-12-06 NOTE — Telephone Encounter (Signed)
Pt does not go to [facility].

## 2014-12-14 ENCOUNTER — Encounter: Payer: Self-pay | Admitting: Obstetrics & Gynecology

## 2014-12-17 ENCOUNTER — Telehealth: Payer: Self-pay | Admitting: Emergency Medicine

## 2014-12-17 NOTE — Telephone Encounter (Signed)
Telephone call for triage created to discuss message with patient and disposition as appropriate.   

## 2014-12-17 NOTE — Telephone Encounter (Signed)
Call to patient. Reviewed message. She states she last took Micronor 12/03/14 and has not had monthly cycle. Patient sexually active. Advised to take home pregnancy test and to return call to office with positive or negative results for further advice. Patient agreeable. She will take test today and return call to office. LMP 11/07/14. Requested patient call directly to office, not to use mychart for this message, patient verbalized understanding.

## 2014-12-17 NOTE — Telephone Encounter (Signed)
Chief Complaint  Patient presents with  . Advice Only    Patient sent mychart message that requires clinical triage     ----- Message -----    From: Minda MeoHIGHTOWER,Rameen    Sent: 12/14/2014  8:46 AM EDT      To: Annamaria BootsMILLER, MARY SUZANNE, MD Subject: Non-Urgent Medical Question  Good morning Dr.Miller  This is Minda Meoomineattra Zachow i was writing you to update you on my current status. I had discontinue taking the birth control for awhile for financial issues. But i did not receive my menstrual cycle for the month of May. While i was on the pill it usually came on on the 27th of each month. I believe the last day i took the pill was on may 23.. But i just wanted to update you on the changes and hopefully get a understanding of why my body reacted the way that it did.  Thank you Sincerely have a good day.

## 2014-12-18 ENCOUNTER — Encounter: Payer: Self-pay | Admitting: Obstetrics & Gynecology

## 2014-12-19 NOTE — Telephone Encounter (Signed)
Telephone call for triage created to discuss message with patient and disposition as appropriate.   

## 2014-12-19 NOTE — Telephone Encounter (Signed)
With negative home UPT, can trigger cycle with Provera challenged.  Provera 10mg  x 10 days.  OK to send to pharmacy.  She can call when she starts her cycle so we know.  She can then start the Micronor with that cycle.

## 2014-12-19 NOTE — Telephone Encounter (Signed)
Call to patient. Negative home pregnancy test. LMP 11/07/14. Advised patient it may take up to 3 months after stopping birth control to have a cycle again. Advised if no cycle by 02/06/15 to call our office to schedule with Dr. Hyacinth MeekerMiller.  Patient would like to restart micronor, advised can restart Micronor with first day of next cycle and take daily at the same time. Advised need for use of back up method for first 7 days of restarting new pills. Emphasized with patient need for back up method at this time until starts cycle and restarts micronor. Patient verbalized understanding. Declines office visit at this time.   Dr. Hyacinth MeekerMiller can you review and advise if instructions given are agreeable?

## 2014-12-19 NOTE — Telephone Encounter (Signed)
Patient sent mychart messsage:    ===View-only below this line===   ----- Message -----    From: Minda MeoHIGHTOWER,Joyceann    Sent: 12/18/2014  5:58 PM EDT      To: Annamaria BootsMILLER, MARY SUZANNE, MD Subject: Non-Urgent Medical Question  Good afternoon. Hope all is well. I spoke with your assistant Monday afternoon about my previous message.. I was told to take a at home pregnancy test and contact you guys with my results. The test did come back negative. Im not sure what the next step should be from here. But i am just updating you on my current condition. Thank you, talk to you soon  Sincerely

## 2014-12-21 MED ORDER — MEDROXYPROGESTERONE ACETATE 10 MG PO TABS: 10.0000 mg | ORAL_TABLET | Freq: Every day | ORAL | Status: DC

## 2014-12-21 NOTE — Telephone Encounter (Signed)
Spoke with patient and message from Dr. Hyacinth Meeker given. Patient agreeable. Advised progesterone 10 mg po. 1 tablet daily for ten days. Then should have a cycle within two weeks of completing Progesterone. Advised patient if begins to have a cycle in the middle of taking progesterone course, to stop progesterone and then restart Micronor. Advised when restarts Micronor, to use back up method of birth control for at least one week. Also, patient advised to return call if does not start cycle two weeks after completing 10 days of Provera 10 mg tablets.   Patient verbalized understanding of all instructions and will call back if does not start a cycle.   Routing to provider for final review. Patient agreeable to disposition. Will close encounter.

## 2015-01-01 ENCOUNTER — Encounter: Payer: Self-pay | Admitting: Family Medicine

## 2015-01-01 ENCOUNTER — Ambulatory Visit: Payer: 59 | Attending: Family Medicine | Admitting: Family Medicine

## 2015-01-01 VITALS — BP 121/86 | HR 97 | Temp 98.5°F | Resp 16 | Ht 64.0 in | Wt 210.0 lb

## 2015-01-01 DIAGNOSIS — N926 Irregular menstruation, unspecified: Secondary | ICD-10-CM | POA: Insufficient documentation

## 2015-01-01 DIAGNOSIS — N911 Secondary amenorrhea: Secondary | ICD-10-CM | POA: Insufficient documentation

## 2015-01-01 DIAGNOSIS — F17200 Nicotine dependence, unspecified, uncomplicated: Secondary | ICD-10-CM | POA: Insufficient documentation

## 2015-01-01 DIAGNOSIS — Z720 Tobacco use: Secondary | ICD-10-CM | POA: Diagnosis not present

## 2015-01-01 LAB — POCT URINE PREGNANCY: Preg Test, Ur: NEGATIVE

## 2015-01-01 NOTE — Assessment & Plan Note (Signed)
A; UPT negative. Amenorrhea x 14 weeks. Patient established with gyn. Gyn has recommended and ordered provera.  P:    Provera is used to reset cycles. I do recommend you try it.   Here are the recommendations from Dr. Hyacinth Meeker: Advised progesterone 10 mg po. 1 tablet daily for ten days. Then should have a cycle within two weeks of completing Progesterone. Advised patient if begins to have a cycle in the middle of taking progesterone course, to stop progesterone and then restart Micronor. Advised when restarts Micronor, to use back up method of birth control for at least one week. Also, patient advised to return call if does not start cycle two weeks after completing 10 days of Provera 10 mg tablets.

## 2015-01-01 NOTE — Patient Instructions (Addendum)
Ms. Moreta,  Very nice to meet you.  1. Irregular periods with no period for 3 months, almost 4. Provera is used to reset cycles. I do recommend you try it.   Here are the recommendations from Dr. Hyacinth Meeker: Advised progesterone 10 mg po. 1 tablet daily for ten days. Then should have a cycle within two weeks of completing Progesterone. Advised patient if begins to have a cycle in the middle of taking progesterone course, to stop progesterone and then restart Micronor. Advised when restarts Micronor, to use back up method of birth control for at least one week. Also, patient advised to return call if does not start cycle two weeks after completing 10 days of Provera 10 mg tablets.    2. Smoking: Smoking cessation support: smoking cessation hotline: 1-800-QUIT-NOW.  Smoking cessation classes are available through [facility]. Call [phone] or visit our website at [email or web address].   F/u in 3 months for flu shot   Dr. Armen Pickup

## 2015-01-01 NOTE — Assessment & Plan Note (Signed)
  2. Smoking: Smoking cessation support: smoking cessation hotline: 1-800-QUIT-NOW.  Smoking cessation classes are available through [facility]. Call [phone] or visit our website at [email or web address].

## 2015-01-01 NOTE — Progress Notes (Signed)
   Subjective:    Patient ID: Kristine [name], female    DOB: [birth year], 29 y.o.   MRN: [id number] CC; meet PCP, secondary amenorrhea  HPI  1. Amenorrhea: LMP 11/07/14. 4/27-09/13/14. Periods always irregular. At one point no period for 15 months. One period lasted for 1 month. Age of [age]. No children. Has tried to conceive. Established with gyn, provera recommended, patient has not started provera.   2. Pancreatitis; resolved per patient. No stomach pain, nausea, fever, greasy stools.   Soc Hx: current smoker, working to quit  Review of Systems  Constitutional: Negative for fever and chills.  Gastrointestinal: Negative for nausea, vomiting, abdominal pain and diarrhea.  Genitourinary: Positive for menstrual problem.       Objective:   Physical Exam BP 121/86 mmHg  Pulse 97  Temp(Src) 98.5 F (36.9 C)  Resp 16  Ht 5\' 4"  (1.626 m)  Wt 210 lb (95.255 kg)  BMI 36.03 kg/m2  SpO2 100%  LMP 11/07/2014 General appearance: alert, cooperative and no distress Lungs: clear to auscultation bilaterally Heart: regular rate and rhythm, S1, S2 normal, no murmur, click, rub or gallop  Abdomen: soft, non-tender; bowel sounds normal; no masses,  no organomegaly Extremities: extremities normal, atraumatic, no cyanosis or edema  U preg: negative     Assessment & Plan:

## 2015-01-01 NOTE — Progress Notes (Signed)
Establish Care Referral pulmonology  Irregular menses

## 2015-01-04 ENCOUNTER — Encounter: Payer: Self-pay | Admitting: Family Medicine

## 2015-01-04 ENCOUNTER — Telehealth: Payer: Self-pay

## 2015-01-04 ENCOUNTER — Encounter: Payer: Self-pay | Admitting: Obstetrics & Gynecology

## 2015-01-04 NOTE — Telephone Encounter (Signed)
Spoke with patient regarding mychart message as seen below. Patient states that she had intercourse on 6/19 and had light spotting after. Spotting did not extend into the next day. Patient then began to experience a "thin watery white" discharge with an odor. Patient denies any pain, fever, and chills. Patient had intercourse last night and experienced light spotting. Denies any current bleeding or discomfort. Patient is concerned after researching symptoms. Advised patient will need to be seen in office for further evaluation to ensure proper care. Patient is agreeable. Patient requesting afternoon appointment for next week. Appointment scheduled for 6/27 at 3:30pm with Dr.Miller. Patient is agreeable to date and time. Will return call if new symptoms develop or symptoms increase. Aware will need to be seen at local urgent care or ER over the weekend if develops increased bleeding, pain, etc. Patient is agreeable.  Routing to provider for final review. Patient agreeable to disposition. Will close encounter.   Patient aware provider will review message and nurse will return call if any additional advice or change of disposition.

## 2015-01-04 NOTE — Telephone Encounter (Signed)
Message     Good morning Dr. Hyacinth Meeker        This is Production manager. Hope all is well im written you to maybe see if you could help me to better understand what is going on with me. I have not yet picked up my prescription to turn my menstrual cycle back on i wanted to see how my body will respond on its own.. But this past week i notice when i have intercourse and i go to clean up its light/med blood on the rag. No pain or anything just blood. When i first notice it i thought maybe my menstrual cycle was coming on but the next morning it was back to normal. I also notice alot of discharge kinda watery white looking, little smell.    Message     [place].    So i Google it and alot different things popped up but the one that caught my attention was Cervical Erosion.. Is there anyway you could tell me what it sound like to you or what i can do at home intel i see you again, make a appt..etc And should i still pick up this prescription.. Back in nov 2014 Dr. Nettie Elm put me on this med and my menstrual came on but that month it was back off again.. If i really really need it ill pick it up today but if not i don't wont to keep spending money on meds thats not working.Josefa Half so Sorry for the long message and inconvenience i just really need help and answers.     Thank you in advance.

## 2015-01-04 NOTE — Telephone Encounter (Signed)
Please see telephone encounter regarding mychart message.

## 2015-01-07 ENCOUNTER — Ambulatory Visit: Payer: Self-pay | Admitting: Obstetrics & Gynecology

## 2015-01-07 ENCOUNTER — Telehealth: Payer: Self-pay | Admitting: Obstetrics & Gynecology

## 2015-01-07 NOTE — Telephone Encounter (Signed)
Called patient and left a message to call back to reschedule her "bleeding after intercourse and discharge with odor." The appointment was cancelled due to Dr. Hyacinth Meeker being delayed in surgery.

## 2015-01-10 NOTE — Telephone Encounter (Signed)
Message left to return call to [place] at [phone] on mobile per dpr to request return call.

## 2015-01-23 NOTE — Telephone Encounter (Signed)
Left message to call Dayannara Pascal at [phone]. 

## 2015-01-25 NOTE — Telephone Encounter (Signed)
Yes.  Encounter closed. 

## 2015-01-25 NOTE — Telephone Encounter (Signed)
Dr.Miller we have attempted to reach this patient x3 with no return call to reschedule appointment. Okay to close encounter?

## 2015-04-11 ENCOUNTER — Encounter: Payer: Self-pay | Admitting: Obstetrics & Gynecology

## 2015-04-15 ENCOUNTER — Encounter: Payer: Self-pay | Admitting: Emergency Medicine

## 2015-04-15 ENCOUNTER — Emergency Department
Admission: EM | Admit: 2015-04-15 | Discharge: 2015-04-15 | Disposition: A | Discharge status: Home / Self Care | Payer: 59 | Attending: Emergency Medicine | Admitting: Emergency Medicine

## 2015-04-15 DIAGNOSIS — K2100 Gastro-esophageal reflux disease with esophagitis, without bleeding: Secondary | ICD-10-CM

## 2015-04-15 DIAGNOSIS — Z720 Tobacco use: Secondary | ICD-10-CM | POA: Diagnosis not present

## 2015-04-15 DIAGNOSIS — Z79899 Other long term (current) drug therapy: Secondary | ICD-10-CM | POA: Insufficient documentation

## 2015-04-15 DIAGNOSIS — K209 Esophagitis, unspecified without bleeding: Secondary | ICD-10-CM

## 2015-04-15 DIAGNOSIS — R079 Chest pain, unspecified: Secondary | ICD-10-CM | POA: Diagnosis present

## 2015-04-15 LAB — CBC
HCT: 39.6 % (ref 35.0–47.0)
Hemoglobin: 13.1 g/dL (ref 12.0–16.0)
MCH: 28.3 pg (ref 26.0–34.0)
MCHC: 33.1 g/dL (ref 32.0–36.0)
MCV: 85.5 fL (ref 80.0–100.0)
PLATELETS: 287 10*3/uL (ref 150–440)
RBC: 4.63 MIL/uL (ref 3.80–5.20)
RDW: 13.3 % (ref 11.5–14.5)
WBC: 11.2 10*3/uL — ABNORMAL HIGH (ref 3.6–11.0)

## 2015-04-15 LAB — COMPREHENSIVE METABOLIC PANEL
ALT: 27 U/L (ref 14–54)
AST: 30 U/L (ref 15–41)
Albumin: 3.9 g/dL (ref 3.5–5.0)
Alkaline Phosphatase: 60 U/L (ref 38–126)
Anion gap: 8 (ref 5–15)
BILIRUBIN TOTAL: 0.3 mg/dL (ref 0.3–1.2)
BUN: 13 mg/dL (ref 6–20)
CHLORIDE: 99 mmol/L — AB (ref 101–111)
CO2: 25 mmol/L (ref 22–32)
CREATININE: 0.77 mg/dL (ref 0.44–1.00)
Calcium: 9.1 mg/dL (ref 8.9–10.3)
Glucose, Bld: 89 mg/dL (ref 65–99)
POTASSIUM: 3.9 mmol/L (ref 3.5–5.1)
Sodium: 132 mmol/L — ABNORMAL LOW (ref 135–145)
TOTAL PROTEIN: 8.1 g/dL (ref 6.5–8.1)

## 2015-04-15 LAB — TROPONIN I

## 2015-04-15 MED ORDER — FAMOTIDINE 20 MG PO TABS: 20.0000 mg | ORAL_TABLET | Freq: Once | ORAL | Status: DC

## 2015-04-15 MED ORDER — FAMOTIDINE 20 MG PO TABS
20.0000 mg | ORAL_TABLET | Freq: Once | ORAL | Status: AC
  Administered 2015-04-15: 20 mg via ORAL
  Filled 2015-04-15: qty 1

## 2015-04-15 MED ORDER — IBUPROFEN 600 MG PO TABS: 600.0000 mg | ORAL_TABLET | Freq: Three times a day (TID) | ORAL | Status: DC | PRN

## 2015-04-15 MED ORDER — ALUM & MAG HYDROXIDE-SIMETH 200-200-20 MG/5ML PO SUSP
30.0000 mL | Freq: Once | ORAL | Status: AC
  Administered 2015-04-15: 30 mL via ORAL
  Filled 2015-04-15 (×2): qty 30

## 2015-04-15 NOTE — ED Notes (Signed)
Pt to ED with c/o midsternal cp, states she also has some pain under left breast, states pain is worse with inspiration and swallowing, states pain also radiates to upper back with some sob

## 2015-04-15 NOTE — Discharge Instructions (Signed)
As we discussed, take the aunt acid. This may take a few days to reduce acid and allow healing. Continue to take it for at least 1 week. He may take Maalox or continue Rolaids if it helps. Return to emergency department if you have more pain, if you have shortness of breath, weakness, or other urgent concerns.  Heartburn Heartburn is a painful, burning feeling in the chest. It may feel worse when you lie down or bend over. Heartburn is caused by stomach acid moving into the tube that carries food from the mouth to the stomach (esophagus). HOME CARE  Take all medicine as told by your doctor.  Raise the head of your bed with blocks only as told by your doctor.  Do not exercise right after eating.  Avoid eating 2 or 3 hours before bed. Do not lie down right after eating.  Eat small meals throughout the day instead of 3 large meals.  Stop smoking if you smoke.  Keep up a healthy weight.  Avoid foods that give you heartburn. Foods you may want to avoid include:  Peppers.  Chocolate.  High-fat foods, including fried foods.  Spicy foods.  Garlic and onions.  Citrus fruits, including oranges, grapefruit, lemons, and limes.  Food containing tomatoes or tomato products.  Mint.  Bubbly (carbonated) drinks and drinks with caffeine.  Vinegar. GET HELP RIGHT AWAY IF:  You have bad chest pain that goes down your arm or into your jaw or neck.  You feel sweaty, dizzy, or lightheaded.  You have trouble breathing.  You throw up (vomit) blood.  You have trouble or pain when swallowing.  You have bloody or black poop (stool).  You have heartburn more than 3 times a week, for more than 2 weeks. MAKE SURE YOU:  Understand these instructions.  Will watch your condition.  Will get help right away if you are not doing well or get worse. Document Released: 03/11/2011 Document Revised: 09/21/2011 Document Reviewed: 03/11/2011 [facility] Patient Information 2015 [place], [place].  This information is not intended to replace advice given to you by your health care provider. Make sure you discuss any questions you have with your health care provider.

## 2015-04-15 NOTE — ED Provider Notes (Signed)
[facility] Emergency Department Provider Note  ____________________________________________  Time seen: 1455  I have reviewed the triage vital signs and the nursing notes.   HISTORY  Chief Complaint Chest Pain  discomfort with swallowing    HPI Kristine [name] is a 29 y.o. female who reports she has had discomfort when she swallows over the past 3-4 days. She believes this may be related to reflux, as she has had this condition in the past, though it has been many years. She tried taking Rolaids with mild improvement. She has not been eating much and she has not tried any other medications. She reports the discomfort is now shifting into the upper right chest as well.  She denies any nausea or vomiting or shortness of breath.  After discussing her symptoms at length, the patient tells me that she was diagnosed with chronic pancreatitis earlier this year and has had extensive testing by gastroenterology, including an endoscopy.     Past Medical History  Diagnosis Date  . Dyspareunia   . STD (sexually transmitted disease) 2012    Tx' d for Chlamydia    Patient Active Problem List   Diagnosis Date Noted  . Current smoker 01/01/2015  . 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 Adverse reaction to birth control pills 10/16/2014  . Lymphadenopathy 09/02/2014  . Excess, menstruation 12/21/2012    Past Surgical History  Procedure Laterality Date  . Breast surgery  2000    benign biopsy    Current Outpatient Rx  Name  Route  Sig  Dispense  Refill  . OVER THE COUNTER MEDICATION   Oral   Take 1 tablet by mouth at bedtime as needed (fpor sleep. [employer] brand sleep aid).         . famotidine (PEPCID) 20 MG tablet   Oral   Take 1 tablet (20 mg total) by mouth once.   30 tablet   1   . ibuprofen (ADVIL,MOTRIN) 600 MG tablet   Oral   Take 1 tablet (600 mg total) by mouth every 8 (eight) hours as needed for moderate pain.   24 tablet   1    . medroxyPROGESTERone (PROVERA) 10 MG tablet   Oral   Take 1 tablet (10 mg total) by mouth daily. Patient not taking: Reported on 01/01/2015   10 tablet   0     Please DC prior order sent under provider Gavin Pound  ...   . norethindrone (MICRONOR,CAMILA,ERRIN) 0.35 MG tablet   Oral   Take 1 tablet (0.35 mg total) by mouth daily. Patient not taking: Reported on 01/01/2015   1 Package   12     Allergies Loestrin and Macrobid  No family history on file.  Social History Social History  Substance Use Topics  . Smoking status: Current Every Day Smoker -- 0.50 packs/day for 3 years    Types: Cigarettes    Start date: 08/29/2008  . Smokeless tobacco: Never Used     Comment: 08-29-14 still smoking  . Alcohol Use: No    Review of Systems  Constitutional: Negative for fever. ENT: Negative for sore throat. Cardiovascular: Positive for chest pain. See history of present illness Respiratory: Negative for shortness of breath. Gastrointestinal: Negative for abdominal pain, vomiting and diarrhea. History of chronic pancreatitis. History of reflux. Current discomfort with swallowing. See history of present illness Genitourinary: Negative for dysuria. Musculoskeletal: No myalgias or injuries. Skin: Negative for rash. Neurological: Negative for headaches   10-point ROS otherwise negative.  ____________________________________________  PHYSICAL EXAM:  VITAL SIGNS: ED Triage Vitals  Enc Vitals Group     BP 04/15/15 1112 137/94 mmHg     Pulse Rate 04/15/15 1112 92     Resp 04/15/15 1112 18     Temp 04/15/15 1112 98.4 F (36.9 C)     Temp Source 04/15/15 1112 Oral     SpO2 04/15/15 1112 100 %     Weight 04/15/15 1112 207 lb (93.895 kg)     Height 04/15/15 1112  (1.626 m)     Head Cir --      Peak Flow --      Pain Score 04/15/15 1113 8     Pain Loc --      Pain Edu? --      Excl. in GC? --     Constitutional: Alert and oriented. Well appearing and in no  distress. ENT   Head: Normocephalic and atraumatic.   Nose: No congestion/rhinnorhea.   Mouth/Throat: Mucous membranes are moist. Cardiovascular: Normal rate, regular rhythm, no murmur noted Respiratory:  Normal respiratory effort, no tachypnea.    Breath sounds are clear and equal bilaterally.  Gastrointestinal: Soft and nontender. No distention.  Back: No muscle spasm, no tenderness, no CVA tenderness. Musculoskeletal: No deformity noted. The patient does have some tenderness in her upper chest as well as notable tightness and discomfort in bilateral trapezius.  No noted edema. Neurologic:  Normal speech and language. No gross focal neurologic deficits are appreciated.  Skin:  Skin is warm, dry. No rash noted. Psychiatric: Mood and affect are normal. Speech and behavior are normal.  ____________________________________________    LABS (pertinent positives/negatives) Labs Reviewed  CBC - Abnormal; Notable for the following:    WBC 11.2 (*)    All other components within normal limits  COMPREHENSIVE METABOLIC PANEL - Abnormal; Notable for the following:    Sodium 132 (*)    Chloride 99 (*)    All other components within normal limits  TROPONIN I     ____________________________________________   EKG  ED ECG REPORT I, Shalaunda Weatherholtz W, the attending physician, personally viewed and interpreted this ECG.   Date: 04/15/2015  EKG Time: 1109  Rate: 86  Rhythm: Normal sinus rhythm  Axis: Normal  Intervals: Normal  ST&T Change: None noted   ____________________________________________    INITIAL IMPRESSION / ASSESSMENT AND PLAN / ED COURSE  Pertinent labs & imaging results that were available during my care of the patient were reviewed by me and considered in my medical decision making (see chart for details).   Well-appearing 29 oral female in no acute distress. She complains of discomfort in her chest, just in the upper portion, worse when she swallows, and  similar to when she was diagnosed with reflux disease back in 2008. She does report that is greater intensity.  She has felt she would not tolerate any medications so she has not tried anything at home other than Rolaids. We will treat her with Maalox and Zantac now. We have discussed the broad differential diagnosis and the need for follow-up.  ____________________________________________   FINAL CLINICAL IMPRESSION(S) / ED DIAGNOSES  Final diagnoses:  Esophagitis  Reflux esophagitis      Darien Ramus, MD 04/15/15 [phone]

## 2015-04-16 ENCOUNTER — Ambulatory Visit: Payer: 59 | Attending: Family Medicine | Admitting: Family Medicine

## 2015-04-16 ENCOUNTER — Encounter: Payer: Self-pay | Admitting: Family Medicine

## 2015-04-16 ENCOUNTER — Other Ambulatory Visit: Payer: Self-pay | Admitting: *Deleted

## 2015-04-16 VITALS — BP 121/86 | HR 72 | Temp 98.6°F | Resp 18 | Ht 64.0 in | Wt 207.0 lb

## 2015-04-16 DIAGNOSIS — R1310 Dysphagia, unspecified: Secondary | ICD-10-CM | POA: Diagnosis not present

## 2015-04-16 DIAGNOSIS — F17210 Nicotine dependence, cigarettes, uncomplicated: Secondary | ICD-10-CM | POA: Diagnosis not present

## 2015-04-16 DIAGNOSIS — Z0000 Encounter for general adult medical examination without abnormal findings: Secondary | ICD-10-CM

## 2015-04-16 DIAGNOSIS — Z23 Encounter for immunization: Secondary | ICD-10-CM | POA: Diagnosis not present

## 2015-04-16 MED ORDER — SUCRALFATE 1 G PO TABS: 1.0000 g | ORAL_TABLET | Freq: Three times a day (TID) | ORAL | Status: DC

## 2015-04-16 NOTE — Patient Instructions (Addendum)
Kristine [name] was seen today for dysphagia.  Diagnoses and all orders for this visit:  Health care maintenance -     Flu Vaccine QUAD 36+ mos PF IM (Fluarix & Fluzone Quad PF)  Odynophagia -     US Abdomen Complete; Future -     DG Esophagus; Future -     sucralfate (CARAFATE) 1 G tablet; Take 1 tablet (1 g total) by mouth 4 (four) times daily -  with meals and at bedtime.   F/u in 4-6 weeks for pain with swallowing  Dr. Armen Pickup

## 2015-04-16 NOTE — Progress Notes (Signed)
Patient is here for swallowing issues. Patient states while swallowing she experiences pain in her chest. Pain has been persistent since Saturday 04/13/2015  Patient complains of pain scaled at a 7. Patient denies any relief. Patient states ibuprofen gives no relief.

## 2015-04-16 NOTE — Progress Notes (Signed)
Patient ID: Kristine [name], female   DOB: Aug 11, 1985, 29 y.o.   MRN: [id number]   Subjective:  Patient ID: Kristine [name], female    DOB: 06/22/1986  Age: 29 y.o. MRN: [id number]  CC: Dysphagia   HPI Jonnie Strohm presents for   1. Pain with swallowing: x 5 days. Started last Saturday AM. Patient was eating a burger. Developed achy pain in chest. Now has achy pain in chest with anything she eats or drinks.  She reports feeling food and beverage going down her throat. Pain sometimes radiates to her R shoulder.  She denies fever, chills, nausea, emesis. She went to the ED yesterday. She was prescribed pepcid for GERD. She reports the pepcid is not helping with symptoms.   Social History  Substance Use Topics  . Smoking status: Current Every Day Smoker -- 0.50 packs/day for 3 years    Types: Cigarettes    Start date: 08/29/2008  . Smokeless tobacco: Never Used     Comment: 08-29-14 still smoking  . Alcohol Use: No   Outpatient Prescriptions Prior to Visit  Medication Sig Dispense Refill  . famotidine (PEPCID) 20 MG tablet Take 1 tablet (20 mg total) by mouth once. 30 tablet 1  . ibuprofen (ADVIL,MOTRIN) 600 MG tablet Take 1 tablet (600 mg total) by mouth every 8 (eight) hours as needed for moderate pain. 24 tablet 1  . OVER THE COUNTER MEDICATION Take 1 tablet by mouth at bedtime as needed (fpor sleep. [employer] brand sleep aid).    . medroxyPROGESTERone (PROVERA) 10 MG tablet Take 1 tablet (10 mg total) by mouth daily. (Patient not taking: Reported on 01/01/2015) 10 tablet 0  . norethindrone (MICRONOR,CAMILA,ERRIN) 0.35 MG tablet Take 1 tablet (0.35 mg total) by mouth daily. (Patient not taking: Reported on 01/01/2015) 1 Package 12   No facility-administered medications prior to visit.    ROS Review of Systems  Constitutional: Negative for fever and chills.  Eyes: Negative for visual disturbance.  Respiratory: Negative for shortness of breath.     Cardiovascular: Negative for chest pain.  Gastrointestinal: Negative for abdominal pain and blood in stool.  Musculoskeletal: Negative for back pain and arthralgias.  Skin: Negative for rash.  Allergic/Immunologic: Negative for immunocompromised state.  Hematological: Negative for adenopathy. Does not bruise/bleed easily.  Psychiatric/Behavioral: Negative for suicidal ideas and dysphoric mood.    Objective:  BP 121/86 mmHg  Pulse 72  Temp(Src) 98.6 F (37 C) (Oral)  Resp 18  Ht  (1.626 m)  Wt 207 lb (93.895 kg)  BMI 35.51 kg/m2  SpO2 100%  LMP 04/02/2015  BP/Weight 04/16/2015 04/15/2015 01/01/2015  Systolic BP 121 122 121  Diastolic BP 86 101 86  Wt. (Lbs) 207 207 210  BMI 35.51 35.51 36.03   Physical Exam  Constitutional: She is oriented to person, place, and time. She appears well-developed and well-nourished. No distress.  HENT:  Head: Normocephalic and atraumatic.  Mouth/Throat: Oropharynx is clear and moist. No oropharyngeal exudate.  Cardiovascular: Normal rate, regular rhythm, normal heart sounds and intact distal pulses.   Pulmonary/Chest: Effort normal and breath sounds normal.  Abdominal: Soft. Bowel sounds are normal. She exhibits no distension. There is no tenderness. There is no rebound and no guarding.  Musculoskeletal: She exhibits no edema.  Neurological: She is alert and oriented to person, place, and time.  Skin: Skin is warm and dry. No rash noted.  Psychiatric: She has a normal mood and affect.     Assessment & Plan:  Problem List Items Addressed This Visit    Odynophagia   Relevant Medications   sucralfate (CARAFATE) 1 G tablet   Other Relevant Orders   US Abdomen Complete   DG Esophagus    Other Visit Diagnoses    Health care maintenance    -  Primary    Relevant Orders    Flu Vaccine QUAD 36+ mos PF IM (Fluarix & Fluzone Quad PF) (Completed)       No orders of the defined types were placed in this encounter.    Follow-up:  Return in about 5 weeks (around 05/21/2015) for pain with swallowing .   Dessa Phi MD

## 2015-04-26 ENCOUNTER — Ambulatory Visit (HOSPITAL_COMMUNITY)
Admission: RE | Admit: 2015-04-26 | Discharge: 2015-04-26 | Disposition: A | Discharge status: Home / Self Care | Payer: 59 | Source: Ambulatory Visit | Attending: Family Medicine | Admitting: Family Medicine

## 2015-04-26 DIAGNOSIS — R1310 Dysphagia, unspecified: Secondary | ICD-10-CM

## 2015-04-26 DIAGNOSIS — R1013 Epigastric pain: Secondary | ICD-10-CM | POA: Diagnosis not present

## 2015-05-30 ENCOUNTER — Encounter: Payer: Self-pay | Admitting: Obstetrics & Gynecology

## 2015-09-04 ENCOUNTER — Other Ambulatory Visit: Payer: BLUE CROSS/BLUE SHIELD

## 2015-09-04 ENCOUNTER — Ambulatory Visit: Payer: No Typology Code available for payment source | Admitting: Hematology & Oncology

## 2015-09-04 ENCOUNTER — Ambulatory Visit (HOSPITAL_BASED_OUTPATIENT_CLINIC_OR_DEPARTMENT_OTHER): Payer: BLUE CROSS/BLUE SHIELD

## 2015-09-24 ENCOUNTER — Encounter: Payer: Self-pay | Admitting: Podiatry

## 2015-09-24 ENCOUNTER — Ambulatory Visit (INDEPENDENT_AMBULATORY_CARE_PROVIDER_SITE_OTHER): Payer: BLUE CROSS/BLUE SHIELD | Admitting: Podiatry

## 2015-09-24 VITALS — BP 155/107 | HR 90 | Resp 18

## 2015-09-24 DIAGNOSIS — L301 Dyshidrosis [pompholyx]: Secondary | ICD-10-CM

## 2015-09-24 MED ORDER — DESOXIMETASONE 0.25 % EX CREA: 1.0000 "application " | TOPICAL_CREAM | Freq: Two times a day (BID) | CUTANEOUS | Status: DC

## 2015-09-24 NOTE — Addendum Note (Signed)
Addended by: Hadley PenOX, Genessis Flanary R on: 09/24/2015 02:02 PM   Modules accepted: Orders

## 2015-09-24 NOTE — Progress Notes (Signed)
   Subjective:    Patient ID: Kristine [name], female    DOB: 01/28/1986, 30 y.o.   MRN: [id number]  HPI  30 year old female presents today for concerns of blisters which keep appearing to the bottom of her right foot. She states that when she pops it does get clear drainage coming from the area. She has tried antifungal treatments for any relief of symptoms. She is also tried Epson salt soaks as well as powders for her shoes without any relief. She states the areas are very itchy. She does not have any other lesions on her body. No family history. No other complaints.   Review of Systems  All other systems reviewed and are negative.      Objective:   Physical Exam General: AAO x3, NAD  Dermatological: On the plantar aspect of the right foot are multiple dry hyperkeratotic lesions from where the previous bulla were present. There is no bulla present today. There is no drainage or pus. There is no swelling or redness. No open lesions.  Vascular: Dorsalis Pedis artery and Posterior Tibial artery pedal pulses are 2/4 bilateral with immedate capillary fill time. Pedal hair growth present. No varicosities and no lower extremity edema present bilateral. There is no pain with calf compression, swelling, warmth, erythema.   Neruologic: Grossly intact via light touch bilateral. Vibratory intact via tuning fork bilateral. Protective threshold with Semmes Wienstein monofilament intact to all pedal sites bilateral. Patellar and Achilles deep tendon reflexes 2+ bilateral. No Babinski or clonus noted bilateral.   Musculoskeletal: No gross boney pedal deformities bilateral. No pain, crepitus, or limitation noted with foot and ankle range of motion bilateral. Muscular strength 5/5 in all groups tested bilateral.  Gait: Unassisted, Nonantalgic.         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 [facility] -Will start Topicort -Discussed dermatology evaluation if no improvement.  -Monitor for any clinical signs or symptoms of infection and directed to call the office immediately should any occur or go to the ER. -Follow-up after biopsy or sooner if any problems arise. In the meantime, encouraged to call the office with any questions, concerns, change in symptoms.   Ovid CurdMatthew Wagoner, DPM

## 2015-10-18 ENCOUNTER — Encounter: Payer: Self-pay | Admitting: Podiatry

## 2015-11-05 ENCOUNTER — Encounter: Payer: Self-pay | Admitting: Podiatry

## 2015-11-06 NOTE — Telephone Encounter (Addendum)
-----   Message from Vivi BarrackMatthew R Wagoner, DPM sent at 11/06/2015  1:21 PM EDT ----- Have her stop the topicort and can you please get a copy of the result for me to review again.  ----- Message -----    From: Marissa NestleValery D O'Connell, RN    Sent: 11/06/2015   8:41 AM      To: Vivi BarrackMatthew R Wagoner, DPM  Dr. Ardelle AntonWagoner, Your message states you signed this skin pathology multiple times, but [facility].  They scanned and did not give your assessment of the results to me.  Do you want me to tell her it is contact dermatitis or eczema? Pt states the cream is worsening the rash.  I informed pt she should stop the Topicort and Dr. Ardelle AntonWagoner was reviewing the results again and would give me further instruction for her.

## 2015-11-07 NOTE — Telephone Encounter (Signed)
-----   Message from Vivi BarrackMatthew R Wagoner, DPM sent at 11/06/2015  5:31 PM EDT ----- She can stop it and do a urea cream ----- Message -----    From: Marissa NestleValery D O'Connell, RN    Sent: 11/06/2015   4:32 PM      To: Vivi BarrackMatthew R Wagoner, DPM  Dr. Ardelle AntonWagoner, You're in luck they're in [facility].  Joya SanValery ----- Message -----    From: Vivi BarrackMatthew R Wagoner, DPM    Sent: 11/06/2015   1:21 PM      To: Marissa NestleValery D O'Connell, RN  Have her stop the topicort and can you please get a copy of the result for me to review again.  ----- Message -----    From: Marissa NestleValery D O'Connell, RN    Sent: 11/06/2015   8:41 AM      To: Vivi BarrackMatthew R Wagoner, DPM  Dr. Ardelle AntonWagoner, Your message states you signed this skin pathology multiple times, but [facility].  They scanned and did not give your assessment of the results to me.  Do you want me to tell her it is contact dermatitis or eczema or?? Pt states the cream is worsening the rash.  Please advise. Joya SanValery

## 2015-11-07 NOTE — Telephone Encounter (Signed)
Emailed Dr. Gabriel RungWagoner's orders to pt.

## 2015-12-09 ENCOUNTER — Encounter: Payer: Self-pay | Admitting: Podiatry

## 2015-12-17 ENCOUNTER — Telehealth: Payer: Self-pay | Admitting: Podiatry

## 2015-12-17 NOTE — Telephone Encounter (Signed)
Phone was disconnected while speaking with pt, tried to callback, left message on vm

## 2015-12-17 NOTE — Telephone Encounter (Signed)
Dr. Ardelle AntonWagoner states Biopsy 09/24/2015 are nonspecific. Left message telling pt I have her biopsy results.  I called pt again and informed pt of the results, and ask how she felt.  Pt states she was no better, the area still remains extremely itchy and it wakes her at night.  I transferred pt to schedulers.

## 2017-01-29 IMAGING — RF DG ESOPHAGUS
8 series · 17 of 24 positions shown · non-contrast
Comparison: 09/06/2014

CLINICAL DATA: Painful swallowing in the chest.  Odynophagia.

EXAM:
ESOPHOGRAM / BARIUM SWALLOW / BARIUM TABLET STUDY
TECHNIQUE: Combined double contrast and single contrast examination performed
using effervescent crystals, thick barium liquid, and thin barium
liquid. The patient was observed with fluoroscopy swallowing a 13 mm
barium sulphate tablet.
FLUOROSCOPY TIME:  Radiation Exposure Index (as provided by the
fluoroscopic device): 205 microGy*m^2

[Series 1: cp_standard · 0.34mm/px · 2 of 78 frames shown (1 of 7)]
[frame 12/78]
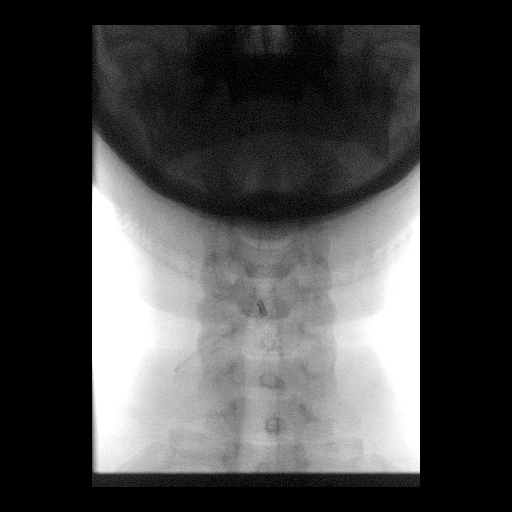
[frame 67/78]
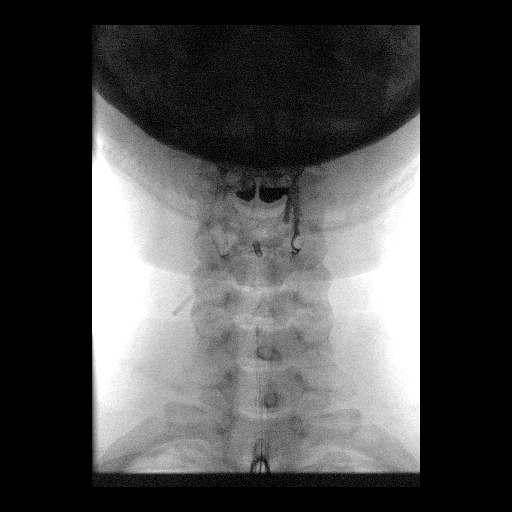

[Series 2: cp_standard · 0.34mm/px · 2 of 83 frames shown (2 of 7)]
[frame 4/83]
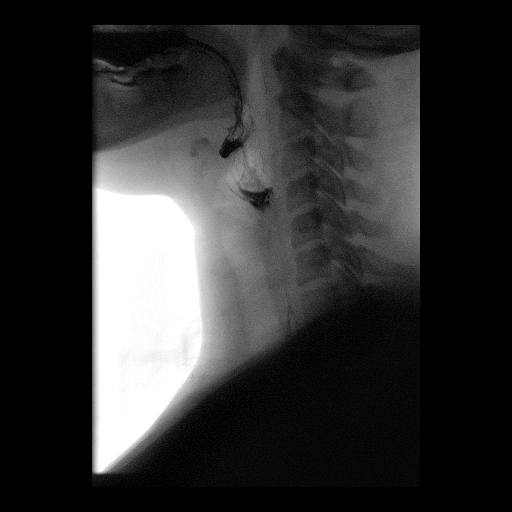
[frame 13/83]
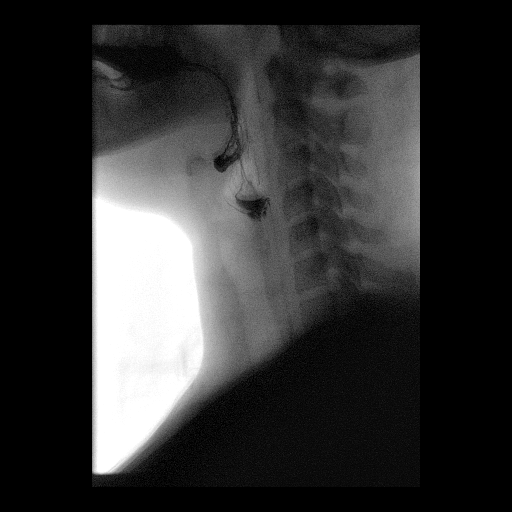

[Series 3: cp_standard · 0.34mm/px · 3 of 65 frames shown (3 of 7)]
[frame 1/65]
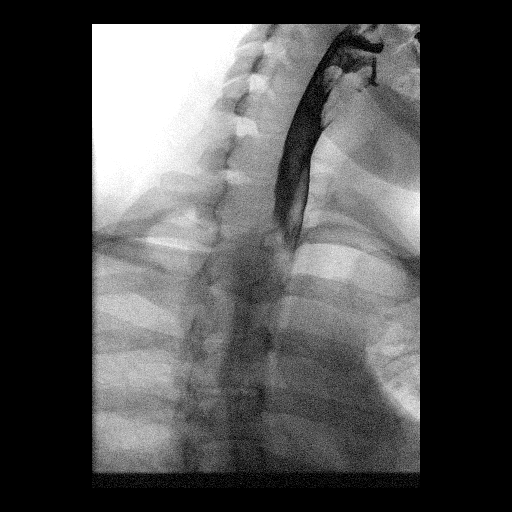
[frame 10/65]
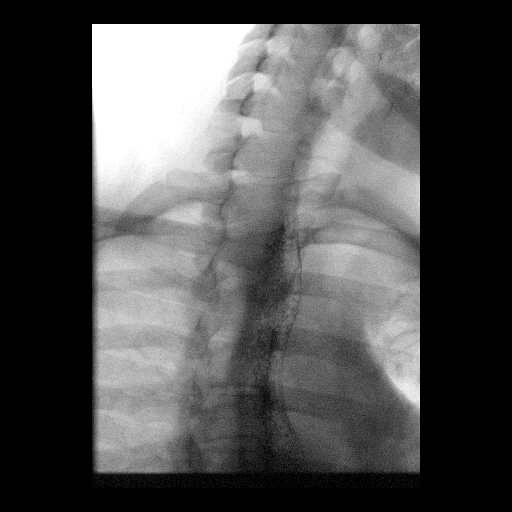
[frame 56/65]
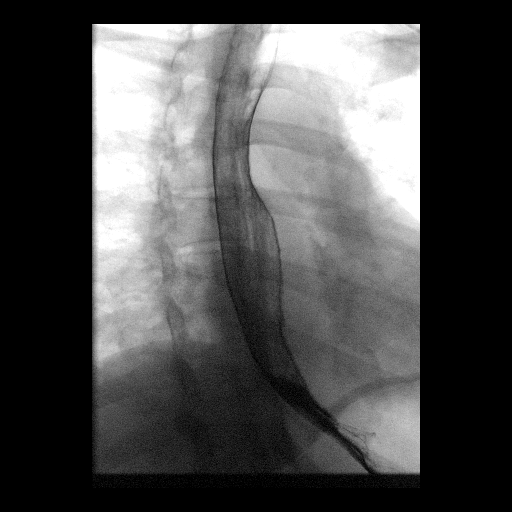

[Series 5: fluoro_barium 2fps_bw · 0.17mm/px · 1 of 1 slices shown]
[im 1/1]
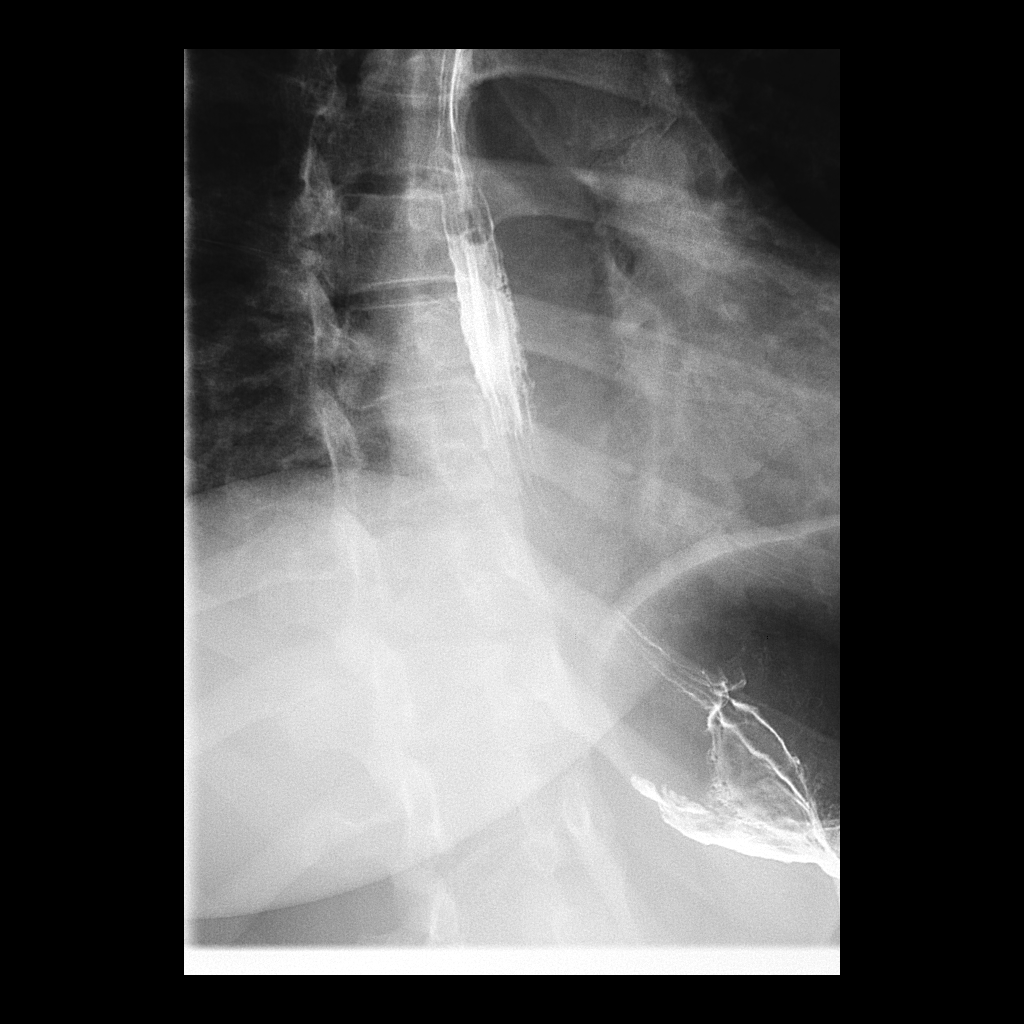

[Series 6: cp_standard · 0.36mm/px · 2 of 62 frames shown (4 of 7)]
[frame 32/62]
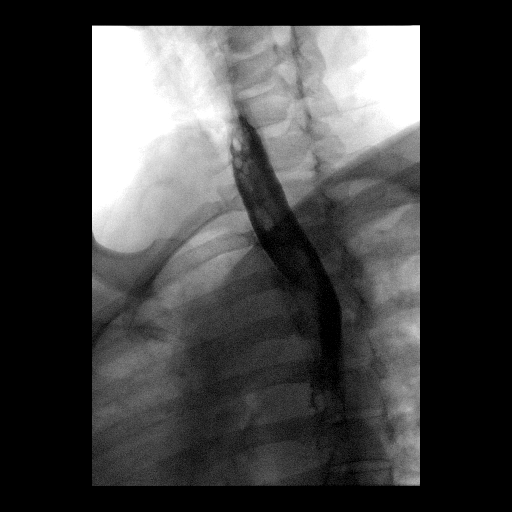
[frame 53/62]
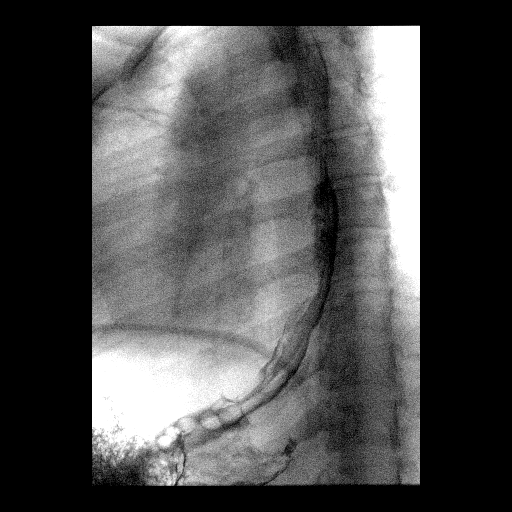

[Series 7: cp_standard · 0.35mm/px · 3 of 50 frames shown (5 of 7)]
[frame 8/50]
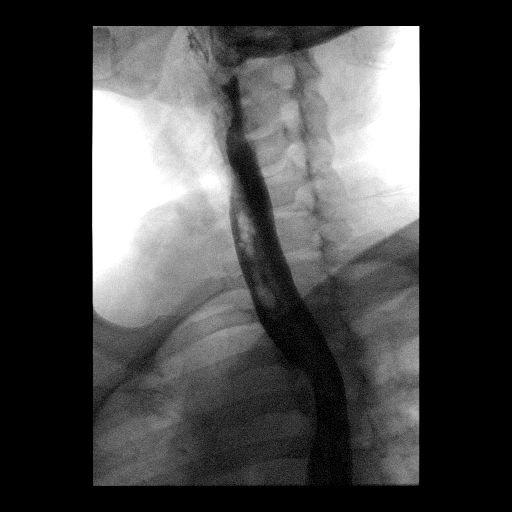
[frame 37/50]
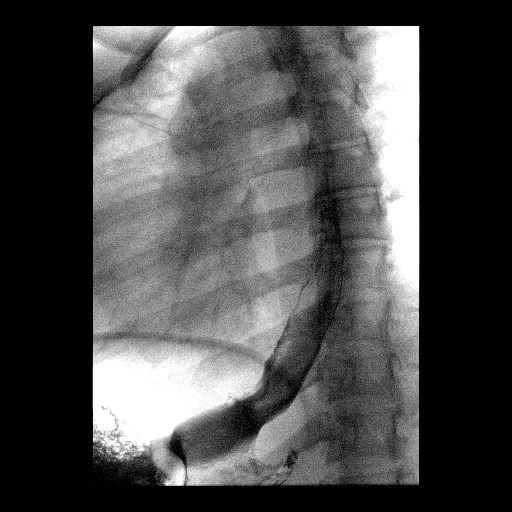
[frame 43/50]
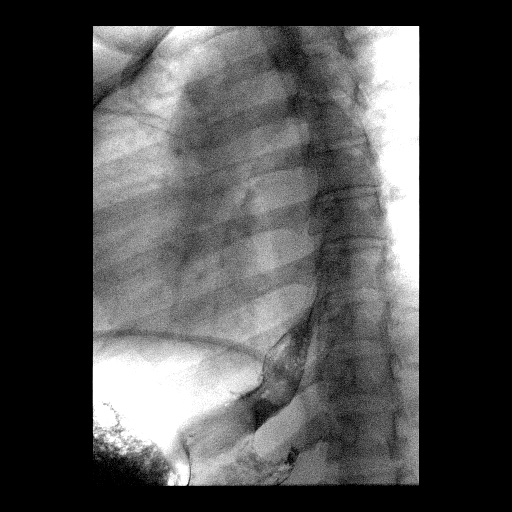

[Series 8: cp_standard · 0.35mm/px · 2 of 54 frames shown (6 of 7)]
[frame 39/54]
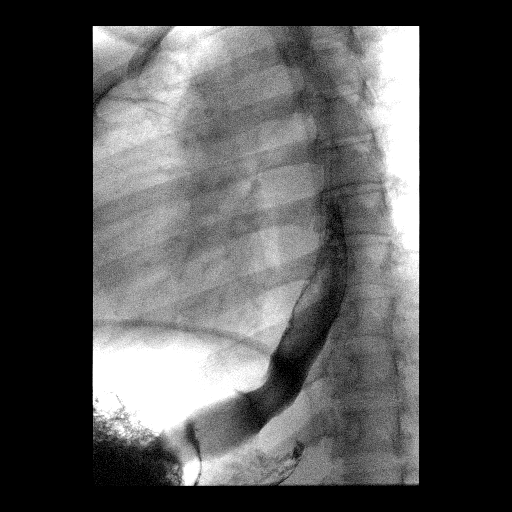
[frame 46/54]
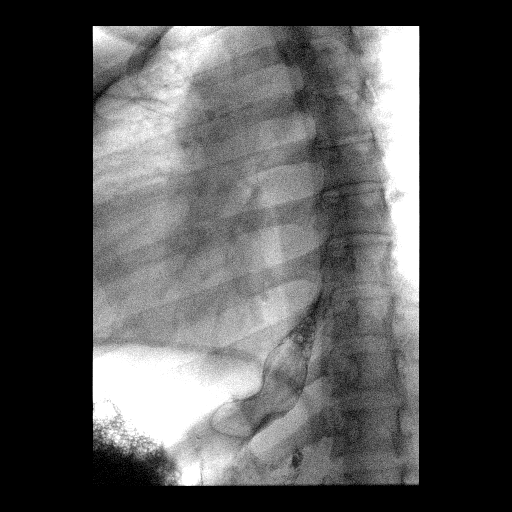

[Series 9: cp_standard · 0.35mm/px · 2 of 60 frames shown (7 of 7)]
[frame 10/60]
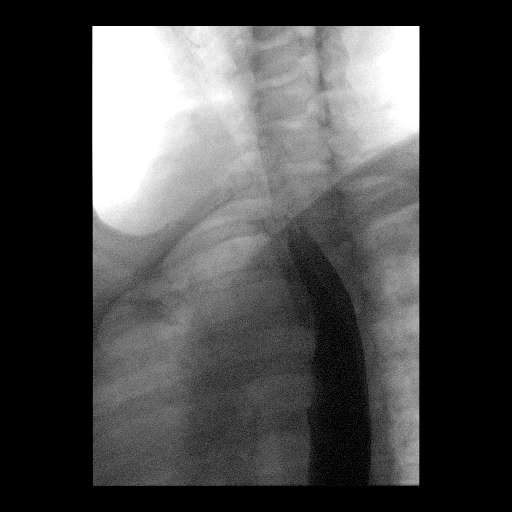
[frame 52/60]
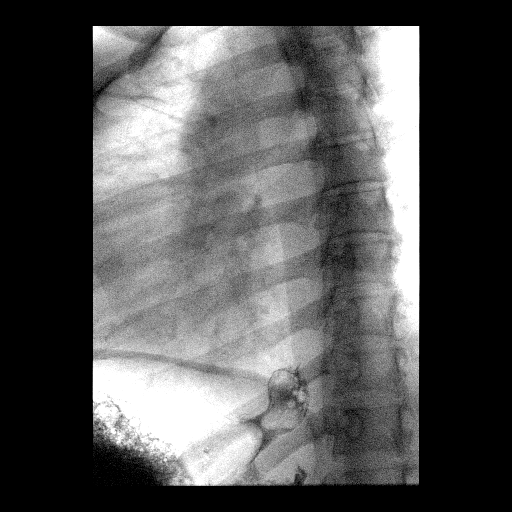

[17 of 24 positions shown; findings below may reference images not displayed]

FINDINGS: Pharyngeal phase of swallowing normal. Primary peristaltic waves
were normal on [DATE] swallows. Distal esophageal mucosal relief
appearance normal.

No esophageal narrowing/ stricture. No significant esophageal
mucosal irregularity.

A 13 mm barium tablet passed without difficulty into the stomach.
IMPRESSION: 1. Normal double contrast esophagram.

## 2019-03-04 ENCOUNTER — Encounter (HOSPITAL_COMMUNITY): Payer: Self-pay

## 2019-03-04 ENCOUNTER — Ambulatory Visit (HOSPITAL_COMMUNITY)
Admission: EM | Admit: 2019-03-04 | Discharge: 2019-03-04 | Disposition: A | Discharge status: Home / Self Care | Payer: BLUE CROSS/BLUE SHIELD | Attending: Family Medicine | Admitting: Family Medicine

## 2019-03-04 DIAGNOSIS — N939 Abnormal uterine and vaginal bleeding, unspecified: Secondary | ICD-10-CM | POA: Insufficient documentation

## 2019-03-04 LAB — CBC WITH DIFFERENTIAL/PLATELET
Abs Immature Granulocytes: 0.05 10*3/uL (ref 0.00–0.07)
Basophils Absolute: 0.1 10*3/uL (ref 0.0–0.1)
Basophils Relative: 1 %
Eosinophils Absolute: 0.3 10*3/uL (ref 0.0–0.5)
Eosinophils Relative: 2 %
HCT: 37.1 % (ref 36.0–46.0)
Hemoglobin: 12.1 g/dL (ref 12.0–15.0)
Immature Granulocytes: 0 %
Lymphocytes Relative: 22 %
Lymphs Abs: 3 10*3/uL (ref 0.7–4.0)
MCH: 29.7 pg (ref 26.0–34.0)
MCHC: 32.6 g/dL (ref 30.0–36.0)
MCV: 91.2 fL (ref 80.0–100.0)
Monocytes Absolute: 0.9 10*3/uL (ref 0.1–1.0)
Monocytes Relative: 7 %
Neutro Abs: 9.1 10*3/uL — ABNORMAL HIGH (ref 1.7–7.7)
Neutrophils Relative %: 68 %
Platelets: 290 10*3/uL (ref 150–400)
RBC: 4.07 MIL/uL (ref 3.87–5.11)
RDW: 13.2 % (ref 11.5–15.5)
WBC: 13.5 10*3/uL — ABNORMAL HIGH (ref 4.0–10.5)
nRBC: 0 % (ref 0.0–0.2)

## 2019-03-04 LAB — POCT PREGNANCY, URINE: Preg Test, Ur: NEGATIVE

## 2019-03-04 LAB — IRON AND TIBC
Iron: 34 ug/dL (ref 28–170)
Saturation Ratios: 9 % — ABNORMAL LOW (ref 10.4–31.8)
TIBC: 371 ug/dL (ref 250–450)
UIBC: 337 ug/dL

## 2019-03-04 NOTE — ED Triage Notes (Signed)
Pt present vaginal bleeding since 01/26/19/ pt states on some days she has been having clots of blood.

## 2019-03-04 NOTE — Discharge Instructions (Addendum)
Please call and schedule an appointment at [facility]. I will call you today once I receive your labs results.

## 2019-03-04 NOTE — ED Provider Notes (Signed)
[facility]    CSN: [id number] Arrival date & time: 03/04/19  1346      History   Chief Complaint Chief Complaint  Patient presents with  . Vaginal Bleeding    HPI Kristine [name] is a 33 y.o. female.   HPI  Patient presents today with concern for vaginal bleeding persisting for more than one month. This is not a new problem and she has been followed in the past by OB-GYN. Regarding this current episode, bleeding has stopped intermittently, however, reoccurs without regarding to a routine menstrual cycle. She has previously been prescribed oral contraceptives in the past however she could not afford. Attempted to order OCP online via a service however, disqualified for OCP prescription due to history of hypertension and current smoker. She is uninsured and has not had any recent primary care follow-up. Endorses fatigue. Denies dizziness, weakness, or history of anemia.  Past Medical History:  Diagnosis Date  . Dyspareunia   . STD (sexually transmitted disease) 2012   Tx' d for Chlamydia    Patient Active Problem List   Diagnosis Date Noted  . Odynophagia 04/16/2015  . Current smoker 01/01/2015  . 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 Adverse reaction to birth control pills 10/16/2014  . Lymphadenopathy 09/02/2014  . Excess, menstruation 12/21/2012    Past Surgical History:  Procedure Laterality Date  . BREAST SURGERY  2000   benign biopsy    OB History    Gravida  0   Para      Term      Preterm      AB      Living        SAB      TAB      Ectopic      Multiple      Live Births               Home Medications    Prior to Admission medications   Medication Sig Start Date End Date Taking? Authorizing Provider  desoximetasone (TOPICORT) 0.25 % cream Apply 1 application topically 2 (two) times daily. 09/24/15   Vivi BarrackWagoner, Matthew R, DPM  famotidine (PEPCID) 20 MG tablet Take 1 tablet (20 mg total) by mouth once. Patient not taking:  Reported on 09/24/2015 04/15/15   Darien RamusKaminski, David W, MD  ibuprofen (ADVIL,MOTRIN) 600 MG tablet Take 1 tablet (600 mg total) by mouth every 8 (eight) hours as needed for moderate pain. Patient not taking: Reported on 09/24/2015 04/15/15   Darien RamusKaminski, David W, MD  OVER THE COUNTER MEDICATION Take 1 tablet by mouth at bedtime as needed (fpor sleep. [employer] brand sleep aid). Reported on 09/24/2015    [provider]  sucralfate (CARAFATE) 1 G tablet Take 1 tablet (1 g total) by mouth 4 (four) times daily -  with meals and at bedtime. Patient not taking: Reported on 09/24/2015 04/16/15   Dessa PhiFunches, Josalyn, MD    Family History History reviewed. No pertinent family history.  Social History Social History   Tobacco Use  . Smoking status: Current Every Day Smoker    Packs/day: 0.50    Years: 3.00    Pack years: 1.50    Types: Cigarettes    Start date: 08/29/2008  . Smokeless tobacco: Never Used  . Tobacco comment: 08-29-14 still smoking  Substance Use Topics  . Alcohol use: No    Alcohol/week: 0.0 standard drinks  . Drug use: No     Allergies   Loestrin [norethindrone acet-ethinyl est]  and Macrobid [nitrofurantoin macrocrystal]   Review of Systems Review of Systems Pertinent negatives listed in HPI Physical Exam Triage Vital Signs ED Triage Vitals  Enc Vitals Group     BP 03/04/19 1426 (!) 136/97     Pulse Rate 03/04/19 1426 86     Resp 03/04/19 1426 16     Temp 03/04/19 1426 98.3 F (36.8 C)     Temp Source 03/04/19 1426 Oral     SpO2 03/04/19 1426 100 %     Weight --      Height --      Head Circumference --      Peak Flow --      Pain Score 03/04/19 1427 2     Pain Loc --      Pain Edu? --      Excl. in [place]? --    No data found.  Updated Vital Signs BP (!) 136/97 (BP Location: Right Arm)   Pulse 86   Temp 98.3 F (36.8 C) (Oral)   Resp 16   SpO2 100%   Visual Acuity Right Eye Distance:   Left Eye Distance:   Bilateral Distance:    Right Eye Near:    Left Eye Near:    Bilateral Near:     Physical Exam General appearance: alert, well developed, well nourished, cooperative and in no distress Head: Normocephalic, without obvious abnormality, atraumatic Respiratory: Respirations even and unlabored, normal respiratory rate Heart: rate and rhythm normal. No gallop or murmurs noted on exam  Abdomen: BS +, no distention, no rebound tenderness, or no mass Extremities: No gross deformities Skin: Skin color, texture, turgor normal. No rashes seen  Psych: Appropriate mood and affect. Neurologic: Mental status: Alert, oriented to person, place, and time, thought content appropriate. UC Treatments / Results  Labs (all labs ordered are listed, but only abnormal results are displayed) Labs Reviewed - No data to display  EKG   Radiology No results found.  Procedures Procedures (including critical care time)  Medications Ordered in UC Medications - No data to display  Initial Impression / Assessment and Plan / UC Course  I have reviewed the triage vital signs and the nursing notes.  Pertinent labs & imaging results that were available during my care of the patient were reviewed by me and considered in my medical decision making (see chart for details).    Patient warrants primary care and gynecological work-up. Patient given information to follow-up with [facility] to establish as a new patient. Agreed to check and CBC and Iron panel to rule out any acute anemia Advised patient will notify her of lab results and prescribed any iron replacement if warranted. Encouraged to 6-8 glasses of water per day. Red flags discussed. Patient verbalized understanding and agreement with plan. Final Clinical Impressions(s) / UC Diagnoses   Final diagnoses:  Abnormal vaginal bleeding     Discharge Instructions     Please call and schedule an appointment at [facility]. I will call you today once I receive your labs results.     ED Prescriptions    None     Controlled Substance Prescriptions [place] Controlled Substance Registry consulted? Not Applicable   Scot Jun, Spicer 03/05/19 1811

## 2019-03-10 ENCOUNTER — Other Ambulatory Visit: Payer: Self-pay

## 2019-03-10 ENCOUNTER — Encounter (INDEPENDENT_AMBULATORY_CARE_PROVIDER_SITE_OTHER): Payer: Self-pay | Admitting: Primary Care

## 2019-03-10 ENCOUNTER — Telehealth (INDEPENDENT_AMBULATORY_CARE_PROVIDER_SITE_OTHER): Payer: Self-pay | Admitting: Primary Care

## 2019-03-10 DIAGNOSIS — Z7689 Persons encountering health services in other specified circumstances: Secondary | ICD-10-CM

## 2019-03-10 DIAGNOSIS — Z09 Encounter for follow-up examination after completed treatment for conditions other than malignant neoplasm: Secondary | ICD-10-CM

## 2019-03-10 DIAGNOSIS — N921 Excessive and frequent menstruation with irregular cycle: Secondary | ICD-10-CM

## 2019-03-10 MED ORDER — PROGESTERONE MICRONIZED 200 MG PO CAPS: 200.0000 mg | ORAL_CAPSULE | Freq: Three times a day (TID) | ORAL | 0 refills | Status: AC

## 2019-03-10 NOTE — Progress Notes (Signed)
Pt has been on her menstrual cycle since July 14. Heavy bleeding with large clots

## 2019-03-12 NOTE — Progress Notes (Signed)
Virtual Visit via Telephone Note  I connected with Kristine [name] on 03/10/19 at 11:10 AM EDT by telephone and verified that I am speaking with the correct person using two identifiers.   I discussed the limitations, risks, security and privacy concerns of performing an evaluation and management service by telephone and the availability of in person appointments. I also discussed with the patient that there may be a patient responsible charge related to this service. The patient expressed understanding and agreed to proceed. WEB  History of Present Illness: Kristine [name] presents for is establishing care with a primary care provider.  She has gone to the emergency room on several different occasions most recently for abnormal vaginal bleeding.  This is her concern today.  Kristine [name] states that she has been on her menstrual cycle since January 24, 2019 and present she is passing large clots and bleeding heavily.   Past Medical History:  Diagnosis Date  . Dyspareunia   . STD (sexually transmitted disease) 2012   Tx' d for Chlamydia   Observations/Objective: Review of Systems  Gastrointestinal: Positive for abdominal pain.  Genitourinary:       Abnormal uterine bleed  All other systems reviewed and are negative.   Assessment and Plan: Ekam was seen today for new patient (initial visit).  Diagnoses and all orders for this visit:  Menorrhagia with irregular cycle Menorrhalgia heavy or prolonged vaginal bleeding with menstrual cycles.  This may be related to hormonal problems, problems with the uterus or other health conditions.  Menstrual cycles can be normal or irregular.  -     Ambulatory referral to Obstetrics / Gynecology  Establishing care with new doctor, encounter for Kristine [name] is establishing primary care requesting a referral to gynecology for menorrhagia.  Hospital discharge follow-up Patient seen at urgent care for vaginal bleeding. Patient given  information to follow-up with [facility] to establi  progesterone (PROMETRIUM) 200 MG capsule; Take 1 capsule (200 mg total) by mouth 3 (three) times daily.  sh as a new patient  Other orders -     progesterone (PROMETRIUM) 200 MG capsule; Take 1 capsule (200 mg total) by mouth 3 (three) times daily.    Follow Up Instructions:    I discussed the assessment and treatment plan with the patient. The patient was provided an opportunity to ask questions and all were answered. The patient agreed with the plan and demonstrated an understanding of the instructions.   The patient was advised to call back or seek an in-person evaluation if the symptoms worsen or if the condition fails to improve as anticipated. I provided 24  minutes of -face-to-face time during this encounter.   Kerin Perna, NP

## 2019-03-16 ENCOUNTER — Encounter (INDEPENDENT_AMBULATORY_CARE_PROVIDER_SITE_OTHER): Payer: Self-pay | Admitting: Primary Care

## 2019-03-16 ENCOUNTER — Other Ambulatory Visit (INDEPENDENT_AMBULATORY_CARE_PROVIDER_SITE_OTHER): Payer: Self-pay | Admitting: Primary Care

## 2019-03-16 DIAGNOSIS — N921 Excessive and frequent menstruation with irregular cycle: Secondary | ICD-10-CM

## 2019-03-23 ENCOUNTER — Encounter: Payer: Self-pay | Admitting: Obstetrics & Gynecology

## 2021-06-14 ENCOUNTER — Other Ambulatory Visit: Payer: Self-pay

## 2021-06-14 ENCOUNTER — Ambulatory Visit (HOSPITAL_COMMUNITY): Admission: EM | Admit: 2021-06-14 | Discharge: 2021-06-14 | Discharge status: Against Medical Advice | Payer: Self-pay
